# Patient Record
Sex: Female | Born: 1945 | Race: White | Hispanic: No | Marital: Single | Smoking: Former smoker
Health system: Southern US, Community
[De-identification: ages and names within clinical notes are randomized; demographics above are authoritative.]

## PROBLEM LIST (undated history)

## (undated) DIAGNOSIS — R011 Cardiac murmur, unspecified: Secondary | ICD-10-CM

## (undated) DIAGNOSIS — B019 Varicella without complication: Secondary | ICD-10-CM

## (undated) DIAGNOSIS — K219 Gastro-esophageal reflux disease without esophagitis: Secondary | ICD-10-CM

## (undated) DIAGNOSIS — I00 Rheumatic fever without heart involvement: Secondary | ICD-10-CM

## (undated) DIAGNOSIS — M1990 Unspecified osteoarthritis, unspecified site: Secondary | ICD-10-CM

## (undated) DIAGNOSIS — M359 Systemic involvement of connective tissue, unspecified: Secondary | ICD-10-CM

## (undated) DIAGNOSIS — K5792 Diverticulitis of intestine, part unspecified, without perforation or abscess without bleeding: Secondary | ICD-10-CM

## (undated) HISTORY — DX: Unspecified osteoarthritis, unspecified site: M19.90

## (undated) HISTORY — DX: Cardiac murmur, unspecified: R01.1

## (undated) HISTORY — DX: Gastro-esophageal reflux disease without esophagitis: K21.9

## (undated) HISTORY — DX: Rheumatic fever without heart involvement: I00

## (undated) HISTORY — DX: Varicella without complication: B01.9

## (undated) HISTORY — DX: Diverticulitis of intestine, part unspecified, without perforation or abscess without bleeding: K57.92

---

## 2009-01-18 ENCOUNTER — Ambulatory Visit: Payer: Self-pay | Admitting: Family Medicine

## 2009-01-27 ENCOUNTER — Ambulatory Visit: Payer: Self-pay | Admitting: Family Medicine

## 2011-01-02 ENCOUNTER — Ambulatory Visit: Payer: Self-pay | Admitting: Family Medicine

## 2011-02-01 ENCOUNTER — Inpatient Hospital Stay: Payer: Self-pay | Admitting: *Deleted

## 2011-03-27 ENCOUNTER — Emergency Department: Payer: Self-pay | Admitting: Emergency Medicine

## 2011-04-16 ENCOUNTER — Ambulatory Visit: Payer: Self-pay | Admitting: Family Medicine

## 2011-04-27 IMAGING — CR CERVICAL SPINE - 2-3 VIEW
1 series · 4 of 4 positions shown · non-contrast
Comparison: none

REASON FOR EXAM: neck pain, shoulder pain
COMMENTS:

[Series 1: view not recorded · 0.17mm/px · 4 of 4 slices shown]
[im 1/4]
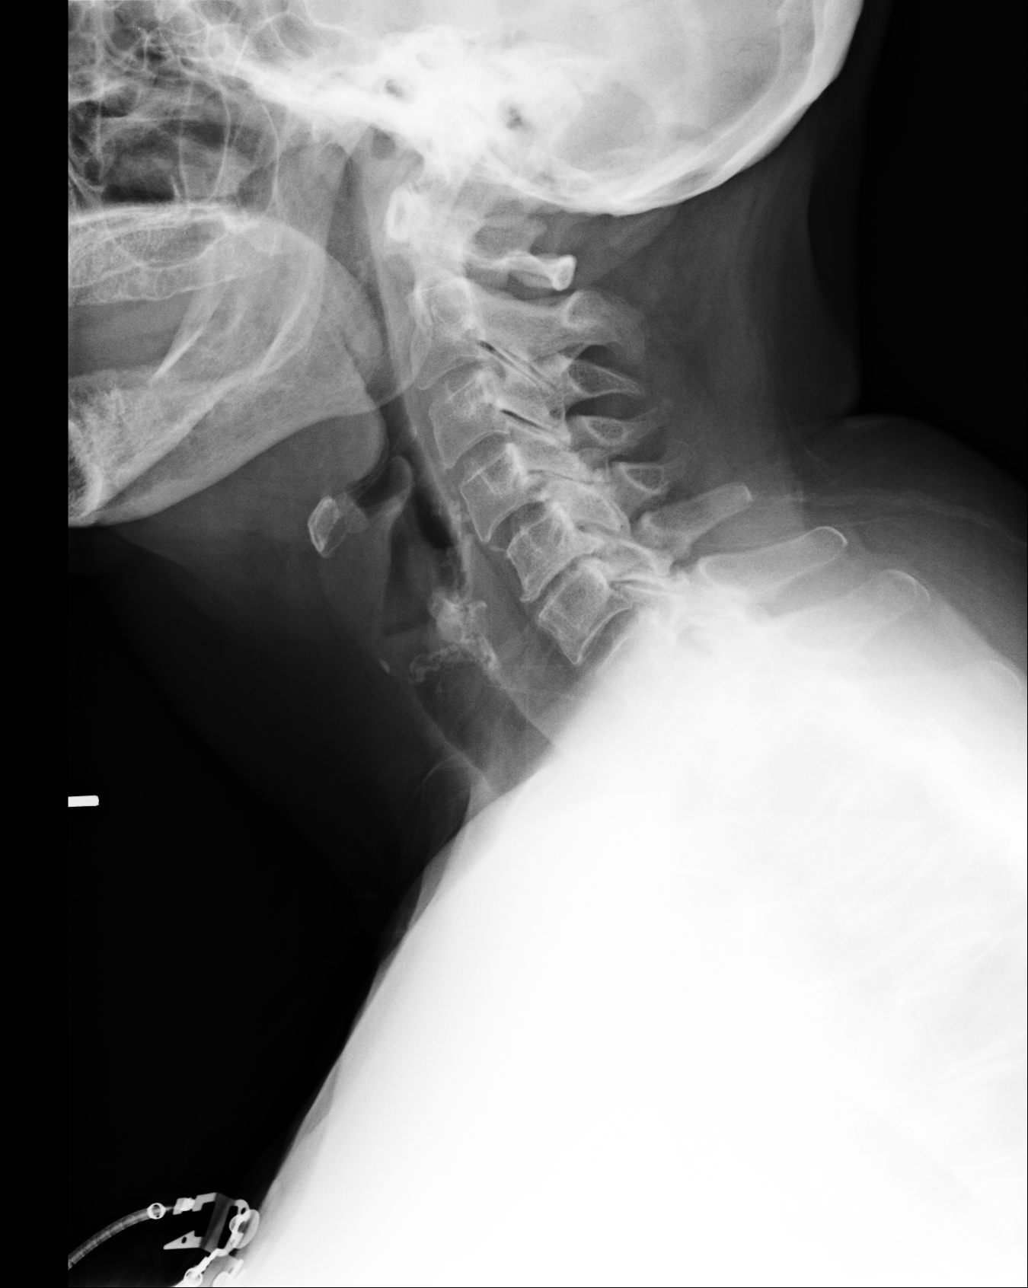
[im 2/4]
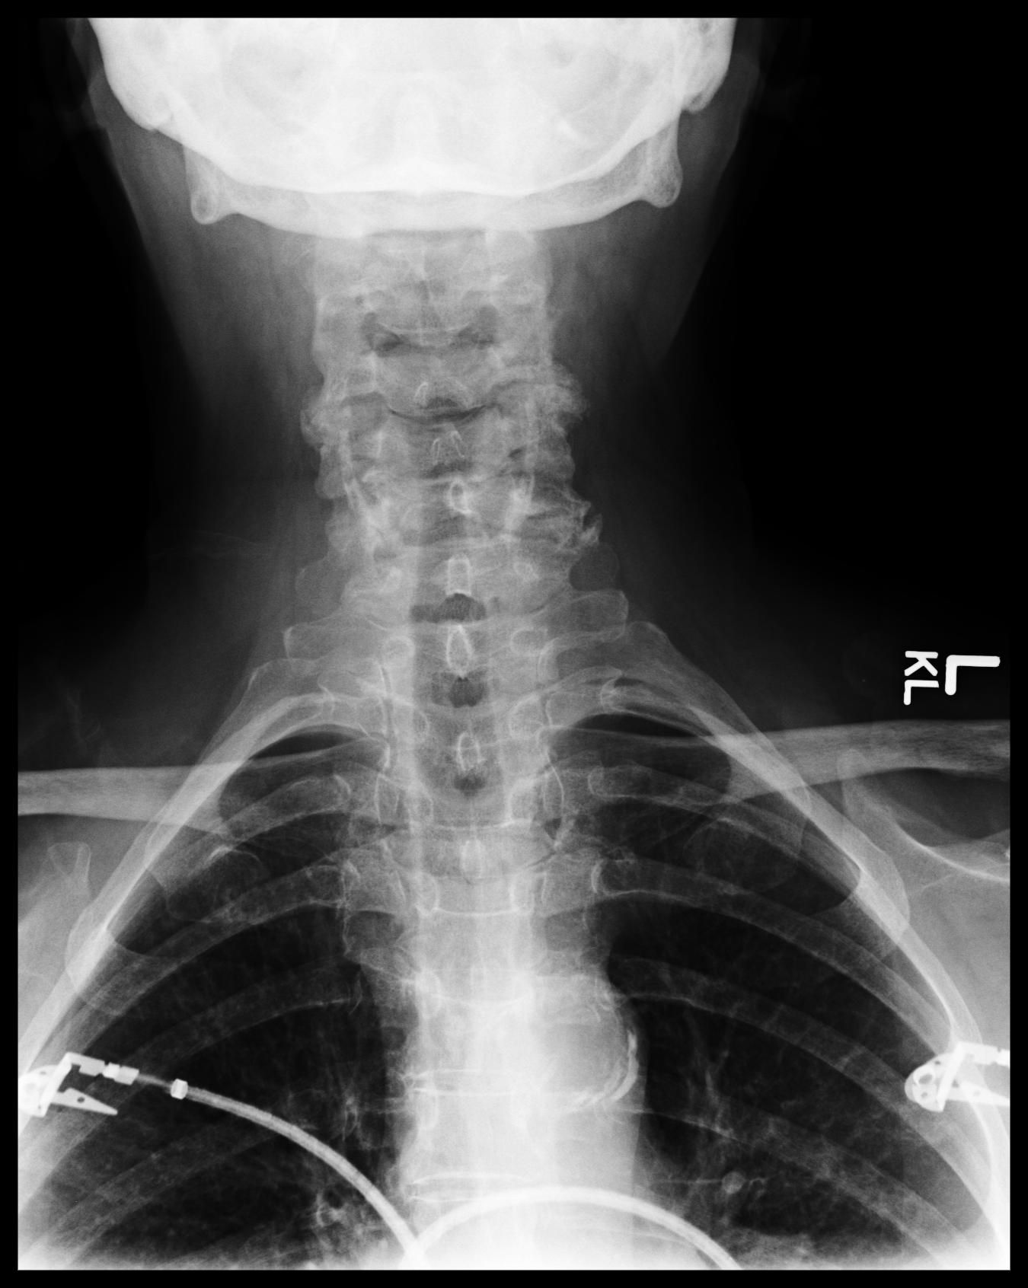
[im 3/4]
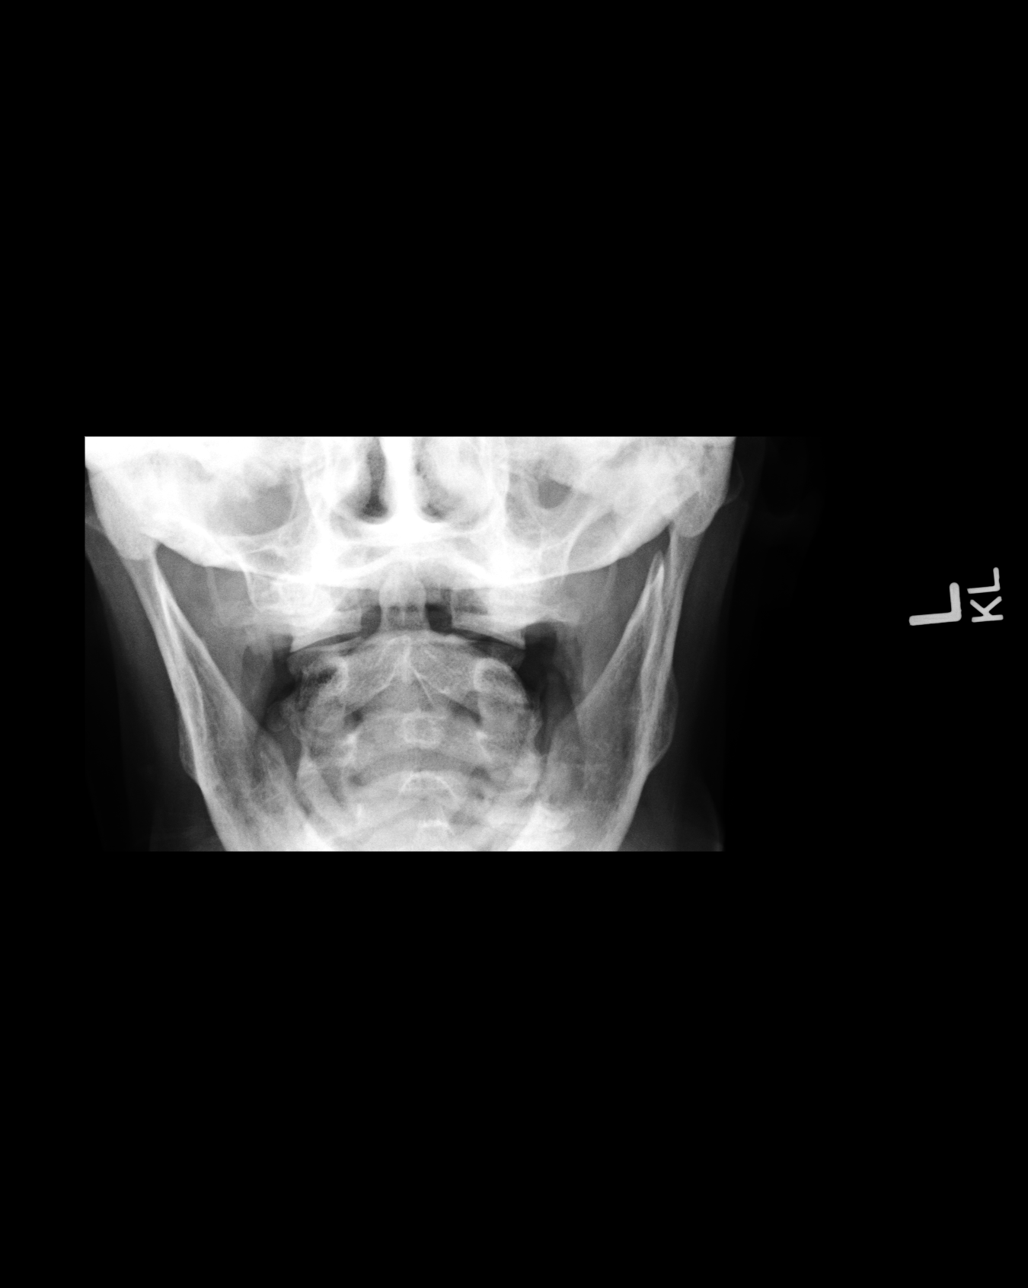
[im 4/4]
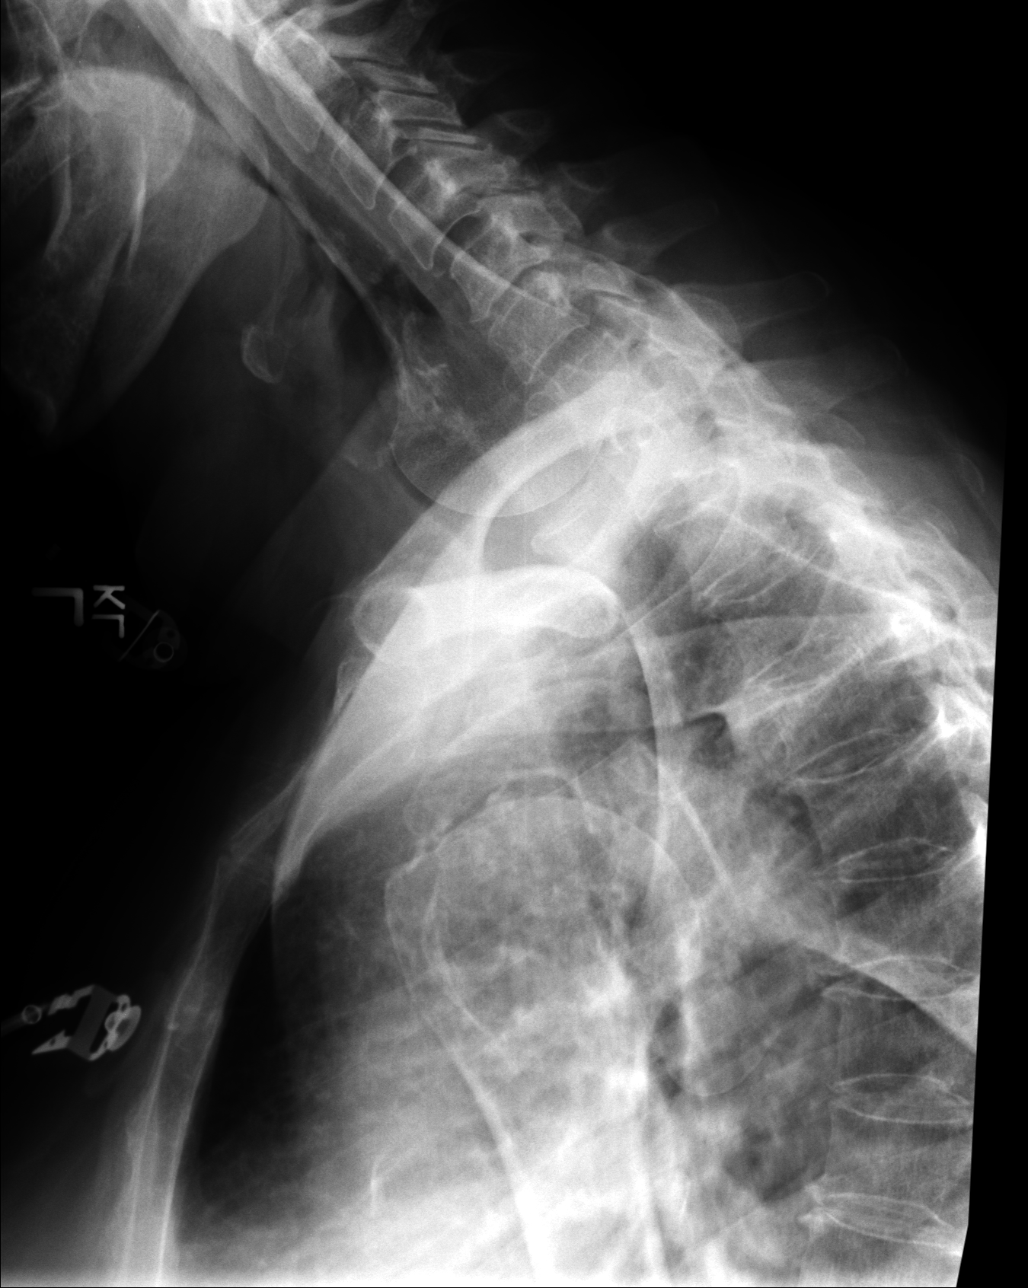

[4 of 4 positions shown; findings below may reference images not displayed]

PROCEDURE:     DXR - DXR C- SPINE AP AND LATERAL  - February 01, 2011  [DATE]

RESULT:     The vertebral body heights are well-maintained. Vertebral body
alignment is normal. No cervical fracture is identified. The odontoid
process is intact. There is narrowing of the C5-C6 cervical disc space
compatible with cervical disc disease and with there being approximately 2
mm retrolisthesis of C5 on C6 that would appear to be on a degenerative
basis. Spur formation is noted at C5-C6 anteriorly and posteriorly.
IMPRESSION: 1. No fracture or other acute change is identified.
2. There is narrowing of the C5-C6 cervical disc space compatible with
cervical disc disease and with there being associated spur formation and a
slight spondylolisthesis without spondylolysis.

## 2014-12-17 ENCOUNTER — Observation Stay: Payer: Self-pay | Admitting: Internal Medicine

## 2014-12-17 LAB — COMPREHENSIVE METABOLIC PANEL
ALBUMIN: 2.6 g/dL — AB (ref 3.4–5.0)
ALK PHOS: 86 U/L
ALT: 11 U/L — AB
Anion Gap: 8 (ref 7–16)
BUN: 7 mg/dL (ref 7–18)
Bilirubin,Total: 0.4 mg/dL (ref 0.2–1.0)
CO2: 22 mmol/L (ref 21–32)
CREATININE: 0.83 mg/dL (ref 0.60–1.30)
Calcium, Total: 8 mg/dL — ABNORMAL LOW (ref 8.5–10.1)
Chloride: 108 mmol/L — ABNORMAL HIGH (ref 98–107)
EGFR (African American): >60
EGFR (Non-African Amer.): >60
Glucose: 135 mg/dL — ABNORMAL HIGH (ref 65–99)
OSMOLALITY: 276 (ref 275–301)
Potassium: 3.3 mmol/L — ABNORMAL LOW (ref 3.5–5.1)
SGOT(AST): 17 U/L (ref 15–37)
SODIUM: 138 mmol/L (ref 136–145)
Total Protein: 7.4 g/dL (ref 6.4–8.2)

## 2014-12-17 LAB — CBC
HCT: 33.2 % — ABNORMAL LOW (ref 35.0–47.0)
HGB: 10.5 g/dL — ABNORMAL LOW (ref 12.0–16.0)
MCH: 27.3 pg (ref 26.0–34.0)
MCHC: 31.7 g/dL — ABNORMAL LOW (ref 32.0–36.0)
MCV: 86 fL (ref 80–100)
Platelet: 275 10*3/uL (ref 150–440)
RBC: 3.86 10*6/uL (ref 3.80–5.20)
RDW: 15.3 % — ABNORMAL HIGH (ref 11.5–14.5)
WBC: 10.1 10*3/uL (ref 3.6–11.0)

## 2014-12-17 LAB — CK-MB
CK-MB: 0.8 ng/mL (ref 0.5–3.6)
CK-MB: 0.7 ng/mL (ref 0.5–3.6)

## 2014-12-17 LAB — SEDIMENTATION RATE: Erythrocyte Sed Rate: 63 mm/hr — ABNORMAL HIGH (ref 0–30)

## 2014-12-17 LAB — PRO B NATRIURETIC PEPTIDE: B-TYPE NATIURETIC PEPTID: 684 pg/mL — AB (ref 0–125)

## 2014-12-17 LAB — TROPONIN I

## 2014-12-18 DIAGNOSIS — R0602 Shortness of breath: Secondary | ICD-10-CM

## 2014-12-18 DIAGNOSIS — R079 Chest pain, unspecified: Secondary | ICD-10-CM

## 2014-12-18 LAB — BASIC METABOLIC PANEL
ANION GAP: 8 (ref 7–16)
BUN: 9 mg/dL (ref 7–18)
CO2: 25 mmol/L (ref 21–32)
CREATININE: 1.04 mg/dL (ref 0.60–1.30)
Calcium, Total: 8.4 mg/dL — ABNORMAL LOW (ref 8.5–10.1)
Chloride: 106 mmol/L (ref 98–107)
GFR CALC NON AF AMER: 56 — AB
Glucose: 107 mg/dL — ABNORMAL HIGH (ref 65–99)
Osmolality: 277 (ref 275–301)
POTASSIUM: 3.4 mmol/L — AB (ref 3.5–5.1)
Sodium: 139 mmol/L (ref 136–145)

## 2014-12-18 LAB — CBC WITH DIFFERENTIAL/PLATELET
BASOS ABS: 0.1 10*3/uL (ref 0.0–0.1)
Basophil %: 1.1 %
Eosinophil #: 0.3 10*3/uL (ref 0.0–0.7)
Eosinophil %: 3.1 %
HCT: 33.5 % — ABNORMAL LOW (ref 35.0–47.0)
HGB: 10.7 g/dL — ABNORMAL LOW (ref 12.0–16.0)
LYMPHS PCT: 14.6 %
Lymphocyte #: 1.6 10*3/uL (ref 1.0–3.6)
MCH: 27.3 pg (ref 26.0–34.0)
MCHC: 31.9 g/dL — AB (ref 32.0–36.0)
MCV: 86 fL (ref 80–100)
MONO ABS: 1.4 x10 3/mm — AB (ref 0.2–0.9)
Monocyte %: 12.6 %
Neutrophil #: 7.4 10*3/uL — ABNORMAL HIGH (ref 1.4–6.5)
Neutrophil %: 68.6 %
Platelet: 306 10*3/uL (ref 150–440)
RBC: 3.91 10*6/uL (ref 3.80–5.20)
RDW: 15.4 % — AB (ref 11.5–14.5)
WBC: 10.8 10*3/uL (ref 3.6–11.0)

## 2014-12-18 LAB — LIPID PANEL
CHOLESTEROL: 172 mg/dL (ref 0–200)
HDL: 63 mg/dL — AB (ref 40–60)
LDL CHOLESTEROL, CALC: 89 mg/dL (ref 0–100)
TRIGLYCERIDES: 99 mg/dL (ref 0–200)
VLDL Cholesterol, Calc: 20 mg/dL (ref 5–40)

## 2014-12-18 LAB — TSH: Thyroid Stimulating Horm: 0.448 u[IU]/mL — ABNORMAL LOW

## 2014-12-18 LAB — CK-MB: CK-MB: 1 ng/mL (ref 0.5–3.6)

## 2014-12-18 LAB — TROPONIN I: Troponin-I: <0.02 ng/mL

## 2014-12-20 LAB — CBC WITH DIFFERENTIAL/PLATELET
BASOS ABS: 0.1 10*3/uL (ref 0.0–0.1)
Basophil %: 1.2 %
Eosinophil #: 0.5 10*3/uL (ref 0.0–0.7)
Eosinophil %: 5.9 %
HCT: 38.5 % (ref 35.0–47.0)
HGB: 12.1 g/dL (ref 12.0–16.0)
LYMPHS PCT: 23.3 %
Lymphocyte #: 2 10*3/uL (ref 1.0–3.6)
MCH: 26.8 pg (ref 26.0–34.0)
MCHC: 31.4 g/dL — AB (ref 32.0–36.0)
MCV: 85 fL (ref 80–100)
Monocyte #: 1.1 x10 3/mm — ABNORMAL HIGH (ref 0.2–0.9)
Monocyte %: 13.3 %
Neutrophil #: 4.7 10*3/uL (ref 1.4–6.5)
Neutrophil %: 56.3 %
Platelet: 341 10*3/uL (ref 150–440)
RBC: 4.5 10*6/uL (ref 3.80–5.20)
RDW: 15.3 % — ABNORMAL HIGH (ref 11.5–14.5)
WBC: 8.4 10*3/uL (ref 3.6–11.0)

## 2014-12-20 LAB — BASIC METABOLIC PANEL
Anion Gap: 8 (ref 7–16)
BUN: 20 mg/dL — ABNORMAL HIGH (ref 7–18)
CHLORIDE: 99 mmol/L (ref 98–107)
Calcium, Total: 8.4 mg/dL — ABNORMAL LOW (ref 8.5–10.1)
Co2: 27 mmol/L (ref 21–32)
Creatinine: 1.05 mg/dL (ref 0.60–1.30)
EGFR (African American): >60
EGFR (Non-African Amer.): 55 — ABNORMAL LOW
GLUCOSE: 114 mg/dL — AB (ref 65–99)
OSMOLALITY: 272 (ref 275–301)
Potassium: 3.5 mmol/L (ref 3.5–5.1)
Sodium: 134 mmol/L — ABNORMAL LOW (ref 136–145)

## 2015-04-22 NOTE — Consult Note (Signed)
General Aspect 69 year old white female with a history of COPD, hypertension, chronic bronchitis, GERD, rheumatoid arthritis, fibromyalgia, very deconditioned, who is currently visiting her daughter from [place], presenting with swelling in her ankles, left side chest pain, SOB. Cardiology was consulted for above sx.  Since flying down from [place], she has had swelling of her ankles, getting worse in the past 10 days, also chest pain the past few days coming and going at rest. SOB with any exertion.  She does have some pain in both of the ankles. She also started having palpitations   She reports the chest pressure to be unrelated to any activity, and it is not worse with exertion. No nausea, no vomiting. The patient has limited ambulation due to her arthritis and fibromyalgia.  She came to [place] to see whether she should go to a nursing home. Daughter also has health issues. Patient is having trouble "washing herself" at home.  review of records so far shows normal echo, negative cardiac enz, normal EKG Some mild left side chest pain, arm pain today  ("maybe it is my heart") LE doppler, no DVT   PAST MEDICAL HISTORY:  1.  COPD.  2.  Hypertension.  3.  Chronic bronchitis.  4.  Gastroesophageal reflux disease.  5.  Rheumatoid arthritis.  6.  Fibromyalgia.   ALLERGIES:  QUESTIONABLE ALLERGY TO PENICILLIN.   MEDICATIONS AT HOME: She is on Stiolto Respimat 2 puffs once a day; Protonix 40 q. daily; methotrexate 2.5, 8 tablets once a week on Monday; lisinopril 20 q. daily; folic acid 1 mg q. daily; aspirin 81, 1 tab p.o. q. daily; amitriptyline 25 mg 2 tablets at bedtime as needed; alprazolam 0.25 q. daily as needed; acetaminophen hydrocodone 325/10, 1 tablet 4 times a day as needed.   SOCIAL HISTORY:  History of smoking, quit 3 years ago, 50 pack-year history. No alcohol or drug use.   FAMILY HISTORY:  Mother with heart disease.   Physical Exam:  GEN well developed, well  nourished, no acute distress   HEENT hearing intact to voice, moist oral mucosa   NECK supple   RESP normal resp effort  clear BS   CARD Regular rate and rhythm  No murmur   ABD denies tenderness   LYMPH negative neck   EXTR negative edema   SKIN normal to palpation   NEURO motor/sensory function intact   PSYCH alert, A+O to time, place, person, good insight   Review of Systems:  Subjective/Chief Complaint chest pain, SOB, leg pain and swelling   General: Fatigue  Weakness  legs weak, chronic issue   Skin: No Complaints   ENT: No Complaints   Eyes: No Complaints   Neck: No Complaints   Respiratory: Short of breath   Cardiovascular: Chest pain or discomfort  Dyspnea   Gastrointestinal: No Complaints   Genitourinary: No Complaints   Vascular: No Complaints   Musculoskeletal: weakness   Neurologic: No Complaints   Hematologic: No Complaints   Endocrine: No Complaints   Psychiatric: No Complaints   Review of Systems: All other systems were reviewed and found to be negative   Medications/Allergies Reviewed Medications/Allergies reviewed     Rheumatoid Arthritis:    Fibromyalgia:    bronchitis:    copd:    htn:   Home Medications: Medication Instructions Status  lisinopril 20 mg tablet 1 tab(s) orally once a day  Active  Stiolto Respimat 2.5 mcg-2.5 mcg inhalation aerosol 2 puff(s) inhaled once a day, As Needed Active  ALPRAZolam 0.25 mg oral tablet 1 tab(s) orally once a day as needed for sleep. Active  amitriptyline 25 mg oral tablet 2 tabs (42m) orally once a day (at bedtime) as needed Active  Aspirin Enteric Coated 81 mg oral delayed release tablet 1 tab(s) orally once a day Active  folic acid 1 mg oral tablet 1 tab(s) orally once a day as needed Active  methotrexate 2.5 mg oral tablet 8 tabs (246m orally once a week on Monday. Active  pantoprazole 40 mg oral delayed release tablet 1 tab(s) orally once a day, As Needed - for Indigestion,  Heartburn Active  acetaminophen-HYDROcodone 325 mg-10 mg oral tablet 1 tab(s) orally 4 times a day, As Needed - for Pain Active   Lab Results:  Thyroid:  20-Dec-15 00:55   Thyroid Stimulating Hormone  0.448 (0.45-4.50 (IU = International Unit)  ----------------------- Pregnant patients have  different reference  ranges for TSH:  - - - - - - - - - -  Pregnant, first trimetser:  0.36 - 2.50 uIU/mL)  Routine Chem:  20-Dec-15 00:55   Glucose, Serum  107  BUN 9  Creatinine (comp) 1.04  Sodium, Serum 139  Potassium, Serum  3.4  Chloride, Serum 106  CO2, Serum 25  Calcium (Total), Serum  8.4  Anion Gap 8  Osmolality (calc) 277  eGFR (African American) >60  eGFR (Non-African American)  56 (eGFR values <6079min/1.73 m2 may be an indication of chronic kidney disease (CKD). Calculated eGFR, using the MRDR Study equation, is useful in  patients with stable renal function. The eGFR calculation will not be reliable in acutely ill patients when serum creatinine is changing rapidly. It is not useful in patients on dialysis. The eGFR calculation may not be applicable to patients at the low and high extremes of body sizes, pregnant women, and vegetarians.)  Cholesterol, Serum 172  Triglycerides, Serum 99  HDL (INHOUSE)  63  VLDL Cholesterol Calculated 20  LDL Cholesterol Calculated 89 (Result(s) reported on 18 Dec 2014 at 01:17AM.)  Cardiac:  19-Dec-15 15:39   Troponin I < 0.02 (0.00-0.05 0.05 ng/mL or less: NEGATIVE  Repeat testing in 3-6 hrs  if clinically indicated. >0.05 ng/mL: POTENTIAL  MYOCARDIAL INJURY. Repeat  testing in 3-6 hrs if  clinically indicated. NOTE: An increase or decrease  of 30% or more on serial  testing suggests a  clinically important change)    20:52   Troponin I < 0.02 (0.00-0.05 0.05 ng/mL or less: NEGATIVE  Repeat testing in 3-6 hrs  if clinically indicated. >0.05 ng/mL: POTENTIAL  MYOCARDIAL INJURY. Repeat  testing in 3-6 hrs if  clinically  indicated. NOTE: An increase or decrease  of 30% or more on serial  testing suggests a  clinically important change)  20-Dec-15 00:55   CPK-MB, Serum 1.0 (Result(s) reported on 18 Dec 2014 at 01:27AM.)  Troponin I < 0.02 (0.00-0.05 0.05 ng/mL or less: NEGATIVE  Repeat testing in 3-6 hrs  if clinically indicated. >0.05 ng/mL: POTENTIAL  MYOCARDIAL INJURY. Repeat  testing in 3-6 hrs if  clinically indicated. NOTE: An increase or decrease  of 30% or more on serial  testing suggests a  clinically important change)  Routine Hem:  20-Dec-15 00:55   WBC (CBC) 10.8  RBC (CBC) 3.91  Hemoglobin (CBC)  10.7  Hematocrit (CBC)  33.5  Platelet Count (CBC) 306  MCV 86  MCH 27.3  MCHC  31.9  RDW  15.4  Neutrophil % 68.6  Lymphocyte % 14.6  Monocyte %  12.6  Eosinophil % 3.1  Basophil % 1.1  Neutrophil #  7.4  Lymphocyte # 1.6  Monocyte #  1.4  Eosinophil # 0.3  Basophil # 0.1 (Result(s) reported on 18 Dec 2014 at 01:10AM.)   EKG:  Interpretation EKG shows normal sinus rhythm, no significant ST ro T wave changes   Radiology Results: XRay:    19-Dec-15 15:22, Chest Portable Single View  Chest Portable Single View   REASON FOR EXAM:    Hypertension  COMMENTS:       PROCEDURE: DXR - DXR PORTABLE CHEST SINGLE VIEW  - Dec 17 2014  3:22PM     CLINICAL DATA:  Bilateral ankle swelling    EXAM:  PORTABLE CHEST - 1 VIEW    COMPARISON:  Radiograph 02/21/2011    FINDINGS:  Normal cardiac silhouette. No effusion, infiltrate, pneumothorax.  Mild bronchitic change centrally.   IMPRESSION:  No acute cardiopulmonary process.      Electronically Signed    By: Suzy Bouchard M.D.    On: 12/17/2014 16:20         Verified By: Rennis Golden, M.D.,  Cardiology:    20-Dec-15 09:29, Echo Doppler  Echo Doppler   REASON FOR EXAM:      COMMENTS:       PROCEDURE: [facility] - ECHO DOPPLER COMPLETE(TRANSTHOR)  - Dec 18 2014  9:29AM     RESULT: Echocardiogram Report    Patient Name:    Linda Mcdaniel Date of Exam: 12/18/2014  Medical Rec #:  [id number]          Custom1:  Date of Birth:  25-Mar-1946       Height:       60.0 in  Patient Age:    53 years        Weight:       162.0 lb  Patient Gender: F               BSA:          1.71 m??    Indications: SOB  Sonographer:    Arville Go RDCS  Referring Phys: Dustin Flock, H    Summary:   1. Essentially a normal study.   2. Left ventricular ejection fraction, by visual estimation, is 60 to   65%.   3. Normal global left ventricular systolic function.   4. Normal right ventricular size and systolic function.   5. Normal RVSP  2D AND M-MODE MEASUREMENTS (normal ranges within parentheses):  Left Ventricle:          Normal  IVSd (2D):      0.96 cm (0.7-1.1)  LVPWd (2D):     1.08 cm (0.7-1.1) Aorta/LA:                  Normal  LVIDd (2D):     4.56 cm (3.4-5.7) AorticRoot (2D): 2.50 cm (2.4-3.7)  LVIDs (2D):     3.14 cm           Left Atrium (2D): 3.50 cm (1.9-4.0)  LV FS (2D):     31.1 %   (>25%)  LV EF (2D):     59.0 %   (>50%)                                    Right Ventricle:    RVd (2D):  LV DIASTOLIC FUNCTION:  MV Peak E: 1.03 m/s E/e' Ratio:  19.90  MV Peak A: 1.20 m/s Decel Time: 248 msec  E/A Ratio: 0.86  SPECTRAL DOPPLER ANALYSIS (where applicable):  Mitral Valve:  MV P1/2 Time: 71.92 msec  MV Area, PHT: 3.06 cm??  Aortic Valve: AoV Max Vel: 1.87 m/s AoV Peak PG: 14.0 mmHg AoV Mean PG:   7.0 mmHg  LVOT Vmax: 1.37 m/s LVOT VTI:  LVOT Diameter: 1.90 cm  AoV Area, Vmax: 2.08 cm?? AoV Area, VTI:  AoV Area, Vmn:  Pulmonic Valve:  PV Max Velocity: 1.40 m/s PV Max PG: 7.8 mmHg PV Mean PG:    PHYSICIAN INTERPRETATION:  Left Ventricle: The left ventricular internal cavity size was normal. LV   posterior wall thickness was normal. No left ventricular hypertrophy.   Global LV systolic function was normal. Left ventricular ejection   fraction, by visual estimation, is 60 to 65%.  Right Ventricle: Normal right  ventricular size, wall thickness, and   systolic function. The right ventricular size is normal. Global RV   systolic function is normal.  Left Atrium: The left atrium is normal in size.  Right Atrium: The right atrium is normal in size.  Pericardium: There is no evidence of pericardial effusion.  Mitral Valve: The mitral valve is normal in structure. Trace mitral valve   regurgitation is seen.  Tricuspid Valve: The tricuspid valve is normal. Trivial tricuspid   regurgitation is visualized.  Aortic Valve: The aortic valve is normal. The aortic valve is   structurally normal, with no evidence of sclerosis or stenosis. No   evidence of aortic valve regurgitation is seen.  Pulmonic Valve: The pulmonic valve is normal. Trace pulmonic valve   regurgitation.  Aorta: The aortic root and ascending aorta are structurally normal, with   no evidence of dilitation.    [id number] Ida Rogue MD  Electronically signed by [id number] Ida Rogue MD  Signature Date/Time: 12/18/2014/12:44:27 PM  *** Final ***    IMPRESSION: .        Verified By: Minna Merritts, M.D., MD    Penicillin: Rash  Vital Signs/Nurse's Notes: **Vital Signs.:   20-Dec-15 11:37  Vital Signs Type Routine  Temperature Temperature (F) 98.2  Celsius 36.7  Temperature Source oral  Pulse Pulse 73  Respirations Respirations 18  Systolic BP Systolic BP 606  Diastolic BP (mmHg) Diastolic BP (mmHg) 65  Mean BP 81  Pulse Ox % Pulse Ox % 96  Pulse Ox Activity Level  At rest  Oxygen Delivery Room Air/ 21 %    Impression 69 year old white female with a history of COPD, hypertension, chronic bronchitis, GERD, rheumatoid arthritis, fibromyalgia, very deconditioned, who is currently visiting her daughter from [place], presenting with swelling in her ankles, left side chest pain, SOB. Cardiology was consulted for above sx.  1) chest pain/angina: 50 yr smoking hx, obese. stuttering chest pain.  Moderate risk of CAD given  hx, some atypical features (at rest) She is concerned about sx, some family hx --Will schedule a lexiscan myoview for tomorrow to rule out ischemia If stress is negative, possibly from fibromyalgia/arthritis  2) Leg swelling normal echo, unable to exclude mild diastolic CHF, there were normal pressures on echo (RVSP <30) but after diuresis --possible exacerbation from venous insuff. --Recommended lasix PRN for leg swelling and TED hose. Order placed for TED hose.  3) SOB: again possibly from mild diastolic CHF (normal RVSP on echo after diuresis though). She is very deconditioned. Needs outpt regular exercise program. This was discussed with her.  Stress test to  rule out ischemia as a cause of her SOB tomorrow  4) Obesity/deconditioning Needs dietary and lifestyle changes Discussed with her  5)Fibromyalgia: she is try to "avoid" pain pills. Currently have left arm pain  6)Arthitis: limits her exercise, also not motivated  7) HTN: would continue lisinopril BP better this AM   Electronic Signatures: Ida Rogue (MD)  (Signed 20-Dec-15 14:51)  Authored: General Aspect/Present Illness, History and Physical Exam, Review of System, Past Medical History, Home Medications, Labs, EKG , Radiology, Allergies, Vital Signs/Nurse's Notes, Impression/Plan   Last Updated: 20-Dec-15 14:51 by Ida Rogue (MD)

## 2015-04-26 NOTE — H&P (Signed)
PATIENT NAME:  Linda Mcdaniel, Linda Mcdaniel MR#:  [id number] DATE OF BIRTH:  March 04, 1946  DATE OF ADMISSION:  12/17/2014  PRIMARY CARE PROVIDER: In [place].   EMERGENCY DEPARTMENT REFERRING DOCTOR: Gladstone Pih, MD    CHIEF COMPLAINT: Chest pressure, ankle swelling.   HISTORY OF PRESENT ILLNESS: The patient is a 69 year old white female with a history of COPD, hypertension, chronic bronchitis, GERD, rheumatoid arthritis, fibromyalgia who is currently visiting her daughter from [place], who noticed that she started swelling in her ankles, both ankles, for the past 10 days; progressively worse. The patient does not have any calf pain. She does not have any significant erythema in the lower extremities. She does have some pain in both of the ankles. She also started having palpitations on and off ongoing since for the past few days. She also complains of left-sided chest pressure ongoing for the past few days. She reports the chest pressure to be unrelated to any activity, and it is not worse with exertion. No nausea, no vomiting. The patient has limited ambulation due to her arthritis and fibromyalgia.   PAST MEDICAL HISTORY:  1.  COPD.  2.  Hypertension.  3.  Chronic bronchitis.  4.  Gastroesophageal reflux disease.  5.  Rheumatoid arthritis.  6.  Fibromyalgia.   ALLERGIES: QUESTIONABLE ALLERGY TO PENICILLIN.   MEDICATIONS AT HOME: She is on Stiolto Respimat 2 puffs once a day; Protonix 40 q. daily; methotrexate 2.5, 8 tablets once a week on Monday; lisinopril 20 q. daily; folic acid 1 mg q. daily; aspirin 81, 1 tab p.o. q. daily; amitriptyline 25 mg 2 tablets at bedtime as needed; alprazolam 0.25 q. daily as needed; acetaminophen hydrocodone 325/10, 1 tablet 4 times a day as needed.   SOCIAL HISTORY: History of smoking, quit 3 years ago, 50 pack-year history. No alcohol or drug use.   FAMILY HISTORY: Mother with heart disease.  REVIEW OF SYSTEMS:  CONSTITUTIONAL: Denies any fevers. Complains of  fatigue, weakness. No weight loss. No weight gain.  EYES: No blurred or double vision. No redness. No inflammation.  ENT: No tinnitus. No ear pain. No hearing loss. No seasonal or year-round allergies.  RESPIRATORY: Has chronic cough. No wheezing. Has a history of COPD.  CARDIOVASCULAR: Complains of chest pressure, edema.  GASTROINTESTINAL: No nausea, vomiting, diarrhea. No abdominal pain. No hematemesis. No melena.  GENITOURINARY: Denies any dysuria, hematuria, renal colic, or frequency.  ENDOCRINE: Denies any polyuria, nocturia. HEMATOLOGIC AND LYMPHATIC: Denies anemia, easy bruisability or bleeding.  SKIN: No acne. No rash.  MUSCULOSKELETAL: Has rheumatoid arthritis, pain related to fibromyalgia.  NEUROLOGIC: No numbness, CVA, TIA, or seizures.  PSYCHIATRIC: Denies any anxiety, insomnia.   PHYSICAL EXAMINATION:  VITAL SIGNS: Temperature 98.3, pulse 92, respirations 19, blood pressure 183/92, O2 of 97%.  GENERAL: The patient is an obese female in no acute distress.  HEENT: Head atraumatic, normocephalic. Pupils equally round, reactive to light and accommodation. There is no conjunctival pallor. No scleral icterus. Nasal exam shows no drainage or ulceration. Oropharynx is clear without any exudate.  NECK: Supple without any thyromegaly.  CARDIOVASCULAR: Regular rate and rhythm. No murmurs, rubs, clicks, or gallops.  LUNGS: Clear to auscultation bilaterally without any rales, rhonchi, wheezing.  ABDOMEN: Soft, nontender, nondistended. Positive bowel sounds x 4.  EXTREMITIES: She has got 1+ edema in her ankles. There is no warmth or erythema.  LYMPH NODES: Nonpalpable.  MUSCULOSKELETAL: There is no erythema or swelling.  NEUROLOGICAL: Awake, alert, oriented x 3. No focal deficits.   EVALUATION: EKG:  Normal sinus rhythm without any ST-T wave changes. Ultrasound Doppler of the lower extremities is negative for DVT. Glucose 135, BUN 7, creatinine 0.83, sodium 138, potassium 3.3, chloride 108,  calcium 8.0. LFTs:  Total protein 7.4, albumin 2.6, bilirubin total 0.4, AST is 11. Troponin less than 0.02. WBC 10.1, hemoglobin 10.5, platelet count 275,000.   ASSESSMENT AND PLAN: The patient is a 69 year old who presents to the Emergency Department  with chest pressure and bilateral lower extremity ankle swelling.  1.  Chest pressure. Multiple risk factors for coronary artery disease. We will cycle cardiac enzymes, place her on aspirin. Cardiology evaluation for further recommendations. 2.  Ankle swelling. We will do compression stockings of the lower extremities, give her some intravenous Lasix. Check a sedimentation rate.  3.  Hypertension. Continue lisinopril; p.r.n. hydralazine. 4.  Rheumatoid arthritis, fibromyalgia. Continue amitriptyline and pain medication as taking at home.  5.  Hypokalemia. I will give her a dose of potassium.  6.  Chronic obstructive pulmonary disease. Continue inhalers as taking at home.  7.  Miscellaneous. The patient will be on Lovenox for deep vein thrombosis prophylaxis.  TIME SPENT: On this patient: 50 minutes.   ____________________________ Lacie Scotts Allena Katz, MD shp:ST D: 12/17/2014 21:20:19 ET T: 12/17/2014 22:12:11 ET JOB#: [id number]  cc: Jazzlyn Huizenga H. Allena Katz, MD, <Dictator> Charise Carwin MD ELECTRONICALLY SIGNED 01/01/2015 12:13

## 2015-04-26 NOTE — Discharge Summary (Signed)
PATIENT NAME:  Linda Mcdaniel, Linda Mcdaniel MR#:  [id number] DATE OF BIRTH:  19-May-1946  DATE OF ADMISSION:  12/17/2014 DATE OF DISCHARGE:  12/20/2014  ADMITTING DIAGNOSIS: Chest pressure and ankle swelling.   DISCHARGE DIAGNOSES: 1. Progressive chest pressure due to non-cardiac cause. 2. Dependent edema.  3. Hypertension.  4. Rheumatoid arthritis.  5. Fibromyalgia.  6. Chronic obstructive pulmonary disease with chronic bronchitis.  7. Gastroesophageal reflux disease.   CONSULTATIONS: Dr. Julien Nordmann, cardiology.   PROCEDURES: 1.  Cardiac stress test, 12/19/2014, shows no ischemia. Reviewed by Dr. Mariah Milling.  2.  Chest x-ray, December 19th, shows no acute cardiopulmonary process. A. 3.  A 2-D echocardiogram performed 12/18/2014, shows left ventricular ejection fraction 60% to 65%. Normal global left ventricular systolic function. Normal right ventricular size and systolic function.  4.  Bilateral lower extremity Dopplers, December 19th, showed no deep venous thrombosis.   HISTORY OF PRESENT ILLNESS: This 69 year old female with past medical history of COPD, hypertension, chronic bronchitis, gastroesophageal reflux disease, rheumatoid arthritis, and fibromyalgia, who is visiting her daughter from [place], presents with complaint of swelling in both ankles for the past several days, which has been getting progressively worse. She also has started having some palpitations and chest pressure.   HOSPITAL COURSE BY PROBLEM:  1.  Chest pressure: The patient ruled out for acute coronary syndrome with negative cardiac enzymes x 3 and no evidence of arrhythmia on telemetry. EKG showed no signs of ischemic change. A 2-D echocardiogram is normal with ejection fraction 60% to 65%. Chest x-ray was negative for pulmonary edema or pneumonia. Lower extremity Dopplers negative for DVT. She did have a Lexiscan, which was negative for any signs of ischemia. Her progressive chest pressure is not likely cardiac in nature.   2.  Bilateral ankle swelling: Bilateral lower extremity Doppler is negative for DVT. She responded very well to Lasix diuresis. Lower extremity swelling has decreased by the time of discharge. She did just fly here from [place]. She endorses a very sedentary lifestyle and is obese. This is most likely chronic venous stasis.  3.  Hypertension: Blood pressure was well controlled on lisinopril and also responded well to Lasix given while in hospital.  4.  Deconditioning:  Physical therapy has evaluated this patient and recommended SNF or skilled nursing facility, but the patient has declined skilled nursing placement, as she is here from [place] and is visiting her daughter. She is being discharged with home health physical therapy.   PERTINENT LABORATORY DATA: Sodium 134, potassium 3.5, chloride 99, bicarbonate 27, BUN 20, creatinine 1.05, glucose 114, total cholesterol 172, LDL is 89, HDL is 63. LFTs are normal with the exception of a decreased albumin at 2.6. Cardiac enzymes negative x 3. TSH slightly low at 0.448. White blood cells 8.4, hemoglobin 12.1, platelets 341, MCV 85.   DISCHARGE PHYSICAL EXAMINATION: VITAL SIGNS: Temperature 97.5, pulse 74, respirations 18, blood pressure 104/58, oxygenation 95% on room air.  GENERAL: No acute distress.  RESPIRATORY: Lungs clear to auscultation bilaterally with good air movement.  CARDIOVASCULAR: Regular rate and rhythm. No murmurs, rubs, or gallops. Peripheral pulses are 2+., and there is trace edema bilaterally, which is equal.  PSYCHIATRIC: The patient seems slightly depressed. She is alert and oriented x4 with good insight into her clinical condition.   CONDITION ON DISCHARGE:  Stable.   DISPOSITION: The patient is being discharged home with home health physical therapy.   DISCHARGE MEDICATIONS: 1. Lisinopril 20 mg 1 tablet daily.  2. Stiolto Respimat 2.5 mcg/2.5 mcg  2 puffs inhaled once a day.  3. Alprazolam 0.25 mg 1 tablet once a day as  needed for sleep.  4. Amitriptyline 25 mg 2 tablets orally once a day at bedtime as needed for sleep.  5. Aspirin 81 mg 1 tablet daily.  6. Folic acid 1 mg 1 tablet daily.  7. Methotrexate 2.5 mg 8 tablets once a week on Mondays.  8. Pantoprazole 40 mg 1 tablet once a day as needed for heartburn.  9. Acetaminophen/hydrocodone 325/10 mg 1 tablet 4 times a day as needed for pain.   DISCHARGE INSTRUCTIONS: DIET: Low-sodium.   ACTIVITY: As tolerated, no restrictions.   TIMEFRAME FOR FOLLOWUP: Follow up within 1-2 weeks with your primary care physician or as soon as you return to [place]. The patient has been instructed to have her primary care physician call our medical records department to have her medical records from this hospitalization faxed, as she did not have their phone number at the time of her admission.   TIME SPENT ON DISCHARGE: 35 minutes.    ____________________________ Ena Dawley. Clent Ridges, MD cpw:mw D: 12/30/2014 19:01:57 ET T: 12/30/2014 19:22:33 ET JOB#: [id number]  cc: Santina Evans P. Clent Ridges, MD, <Dictator> Gale Journey MD ELECTRONICALLY SIGNED 01/01/2015 20:10

## 2016-04-01 ENCOUNTER — Encounter: Payer: Self-pay | Admitting: Nurse Practitioner

## 2016-04-01 ENCOUNTER — Ambulatory Visit (INDEPENDENT_AMBULATORY_CARE_PROVIDER_SITE_OTHER): Payer: Medicare Other | Admitting: Nurse Practitioner

## 2016-04-01 VITALS — BP 130/80 | HR 76 | Temp 98.4°F | Ht <= 58 in | Wt 142.8 lb

## 2016-04-01 DIAGNOSIS — R6889 Other general symptoms and signs: Secondary | ICD-10-CM

## 2016-04-01 DIAGNOSIS — K219 Gastro-esophageal reflux disease without esophagitis: Secondary | ICD-10-CM

## 2016-04-01 DIAGNOSIS — M069 Rheumatoid arthritis, unspecified: Secondary | ICD-10-CM | POA: Diagnosis not present

## 2016-04-01 DIAGNOSIS — Z7689 Persons encountering health services in other specified circumstances: Secondary | ICD-10-CM

## 2016-04-01 DIAGNOSIS — J449 Chronic obstructive pulmonary disease, unspecified: Secondary | ICD-10-CM

## 2016-04-01 DIAGNOSIS — Z7189 Other specified counseling: Secondary | ICD-10-CM | POA: Diagnosis not present

## 2016-04-01 DIAGNOSIS — I00 Rheumatic fever without heart involvement: Secondary | ICD-10-CM

## 2016-04-01 DIAGNOSIS — R011 Cardiac murmur, unspecified: Secondary | ICD-10-CM

## 2016-04-01 DIAGNOSIS — Z791 Long term (current) use of non-steroidal anti-inflammatories (NSAID): Secondary | ICD-10-CM

## 2016-04-01 DIAGNOSIS — K5730 Diverticulosis of large intestine without perforation or abscess without bleeding: Secondary | ICD-10-CM

## 2016-04-01 DIAGNOSIS — Z86718 Personal history of other venous thrombosis and embolism: Secondary | ICD-10-CM

## 2016-04-01 DIAGNOSIS — I10 Essential (primary) hypertension: Secondary | ICD-10-CM

## 2016-04-01 DIAGNOSIS — IMO0001 Reserved for inherently not codable concepts without codable children: Secondary | ICD-10-CM

## 2016-04-01 DIAGNOSIS — M797 Fibromyalgia: Secondary | ICD-10-CM

## 2016-04-01 NOTE — Progress Notes (Signed)
Patient ID: Linda Mcdaniel, female    DOB: [birth year]  Age: 70 y.o. MRN: [id number]  CC: Establish Care   HPI Linda Mcdaniel presents for establishing care and CC of overall arthralgias. She is accompanied by her daughter today.   1) In pain- all over she reports  Has RA and was on methotrexate for more than 5 yrs, but less than 10 yrs. Had a rheumatologist in [place]. Chooses not to pursue a referral at this time. She reports she would like to permanently refuse mammograms and colonoscopies.   Pt also reports a h/o fibromyalgia  Ibuprofen and Naproxen- every once an awhile for when it gets bad. She is only taking a multivitamin daily currently. She does not like to take medications. She is currently "dealing" with the pain and is afraid of pain medications. She would like to discuss home PT as a possibility.    History Linda Mcdaniel has a past medical history of Arthritis; Chicken pox; GERD (gastroesophageal reflux disease); Diverticulitis; Heart murmur; and Rheumatic fever.   She has past surgical history that includes Cesarean section (1965) and Cesarean section (1978).   Her family history includes Alcohol abuse in her brother, father, and mother; Arthritis in her maternal grandmother; Diabetes in her sister; Heart disease in her mother; Hypertension in her mother; Stroke in her maternal grandmother and mother.She reports that she has quit smoking. She has never used smokeless tobacco. She reports that she does not drink alcohol or use illicit drugs.  No outpatient prescriptions prior to visit.   No facility-administered medications prior to visit.    ROS Review of Systems  Constitutional: Negative for fever, chills, diaphoresis and fatigue.  Respiratory: Negative for chest tightness, shortness of breath and wheezing.   Cardiovascular: Negative for chest pain, palpitations and leg swelling.  Gastrointestinal: Negative for nausea, vomiting and diarrhea.  Musculoskeletal: Positive for  myalgias, joint swelling, arthralgias and gait problem. Negative for back pain, neck pain and neck stiffness.  Skin: Negative for rash.  Neurological: Negative for dizziness, weakness, numbness and headaches.  Psychiatric/Behavioral: The patient is not nervous/anxious.     Objective:  BP 130/80 mmHg  Pulse 76  Temp(Src) 98.4 F (36.9 C) (Oral)  Ht 4' 9.8" (1.468 m)  Wt 142 lb 12 oz (64.751 kg)  BMI 30.05 kg/m2  SpO2 97%  Physical Exam  Constitutional: She is oriented to person, place, and time. She appears well-developed and well-nourished. No distress.  HENT:  Head: Normocephalic and atraumatic.  Right Ear: External ear normal.  Left Ear: External ear normal.  Eyes: Right eye exhibits no discharge. Left eye exhibits no discharge. No scleral icterus.  Cardiovascular: Normal rate and regular rhythm.   Murmur heard. Pulmonary/Chest: Effort normal and breath sounds normal. No respiratory distress. She has no wheezes. She has no rales. She exhibits no tenderness.  Musculoskeletal: She exhibits tenderness. She exhibits no edema.  Difficulty going from sitting to standing   Neurological: She is alert and oriented to person, place, and time. No cranial nerve deficit. She exhibits abnormal muscle tone. Coordination abnormal.  Using cane for ambulation  Skin: Skin is warm and dry. No rash noted. She is not diaphoretic.  Psychiatric: She has a normal mood and affect. Her behavior is normal. Judgment and thought content normal.   Assessment & Plan:   Linda Mcdaniel was seen today for establish care.  Diagnoses and all orders for this visit:  NSAID long-term use -     Comp Met (CMET)  Cold intolerance -  CBC with Differential/Platelet -     TSH -     Ferritin  Encounter to establish care  Rheumatoid arthritis involving multiple sites, unspecified rheumatoid factor presence (HCC)  Gastroesophageal reflux disease without esophagitis  Diverticulosis of colon without  hemorrhage  COPD bronchitis  Heart murmur  H/O blood clots  Rheumatic fever  Benign essential HTN  Fibromyalgia   I am having Linda Mcdaniel maintain her Multiple Vitamins-Minerals (MULTIVITAMIN ADULT PO).  Meds ordered this encounter  Medications  . Multiple Vitamins-Minerals (MULTIVITAMIN ADULT PO)    Sig: Take by mouth.     Follow-up: Return in about 6 weeks (around 05/13/2016) for Memory and pain follow up .

## 2016-04-01 NOTE — Patient Instructions (Addendum)
We will check memory next time.   Please visit the lab and if everything looks good we will send in a medication to help with pain (Diclofenac).   Welcome to [facility]! Nice to meet you.

## 2016-04-02 LAB — CBC WITH DIFFERENTIAL/PLATELET
BASOS ABS: 0.1 10*3/uL (ref 0.0–0.1)
Basophils Relative: 0.7 % (ref 0.0–3.0)
Eosinophils Absolute: 0.2 10*3/uL (ref 0.0–0.7)
Eosinophils Relative: 2.5 % (ref 0.0–5.0)
HCT: 36.3 % (ref 36.0–46.0)
Hemoglobin: 11.7 g/dL — ABNORMAL LOW (ref 12.0–15.0)
LYMPHS ABS: 2.1 10*3/uL (ref 0.7–4.0)
Lymphocytes Relative: 22.4 % (ref 12.0–46.0)
MCHC: 32.3 g/dL (ref 30.0–36.0)
MCV: 81.6 fl (ref 78.0–100.0)
MONO ABS: 0.9 10*3/uL (ref 0.1–1.0)
MONOS PCT: 9.9 % (ref 3.0–12.0)
NEUTROS ABS: 6.1 10*3/uL (ref 1.4–7.7)
NEUTROS PCT: 64.5 % (ref 43.0–77.0)
PLATELETS: 391 10*3/uL (ref 150.0–400.0)
RBC: 4.45 Mil/uL (ref 3.87–5.11)
RDW: 17.5 % — ABNORMAL HIGH (ref 11.5–15.5)
WBC: 9.4 10*3/uL (ref 4.0–10.5)

## 2016-04-02 LAB — COMPREHENSIVE METABOLIC PANEL
ALK PHOS: 90 U/L (ref 39–117)
ALT: 6 U/L (ref 0–35)
AST: 13 U/L (ref 0–37)
Albumin: 3.4 g/dL — ABNORMAL LOW (ref 3.5–5.2)
BILIRUBIN TOTAL: 0.5 mg/dL (ref 0.2–1.2)
BUN: 8 mg/dL (ref 6–23)
CO2: 25 meq/L (ref 19–32)
Calcium: 9.2 mg/dL (ref 8.4–10.5)
Chloride: 100 mEq/L (ref 96–112)
Creatinine, Ser: 0.65 mg/dL (ref 0.40–1.20)
GFR: 95.46 mL/min (ref >60.00)
GLUCOSE: 92 mg/dL (ref 70–99)
Potassium: 4.3 mEq/L (ref 3.5–5.1)
SODIUM: 133 meq/L — AB (ref 135–145)
TOTAL PROTEIN: 8.2 g/dL (ref 6.0–8.3)

## 2016-04-02 LAB — TSH: TSH: 1.89 u[IU]/mL (ref 0.35–4.50)

## 2016-04-02 LAB — FERRITIN: FERRITIN: 44.4 ng/mL (ref 10.0–291.0)

## 2016-04-06 ENCOUNTER — Encounter: Payer: Self-pay | Admitting: Nurse Practitioner

## 2016-04-08 ENCOUNTER — Encounter: Payer: Self-pay | Admitting: Nurse Practitioner

## 2016-04-08 DIAGNOSIS — M069 Rheumatoid arthritis, unspecified: Secondary | ICD-10-CM | POA: Insufficient documentation

## 2016-04-08 DIAGNOSIS — K5730 Diverticulosis of large intestine without perforation or abscess without bleeding: Secondary | ICD-10-CM | POA: Insufficient documentation

## 2016-04-08 DIAGNOSIS — R6889 Other general symptoms and signs: Secondary | ICD-10-CM | POA: Insufficient documentation

## 2016-04-08 DIAGNOSIS — R011 Cardiac murmur, unspecified: Secondary | ICD-10-CM | POA: Insufficient documentation

## 2016-04-08 DIAGNOSIS — IMO0001 Reserved for inherently not codable concepts without codable children: Secondary | ICD-10-CM | POA: Insufficient documentation

## 2016-04-08 DIAGNOSIS — M797 Fibromyalgia: Secondary | ICD-10-CM | POA: Insufficient documentation

## 2016-04-08 DIAGNOSIS — K219 Gastro-esophageal reflux disease without esophagitis: Secondary | ICD-10-CM | POA: Insufficient documentation

## 2016-04-08 DIAGNOSIS — I10 Essential (primary) hypertension: Secondary | ICD-10-CM | POA: Insufficient documentation

## 2016-04-08 DIAGNOSIS — Z86718 Personal history of other venous thrombosis and embolism: Secondary | ICD-10-CM | POA: Insufficient documentation

## 2016-04-08 DIAGNOSIS — I00 Rheumatic fever without heart involvement: Secondary | ICD-10-CM | POA: Insufficient documentation

## 2016-04-08 DIAGNOSIS — Z791 Long term (current) use of non-steroidal anti-inflammatories (NSAID): Secondary | ICD-10-CM | POA: Insufficient documentation

## 2016-04-08 DIAGNOSIS — Z7689 Persons encountering health services in other specified circumstances: Secondary | ICD-10-CM | POA: Insufficient documentation

## 2016-04-08 MED ORDER — DICLOFENAC SODIUM 50 MG PO TBEC: 50.0000 mg | DELAYED_RELEASE_TABLET | Freq: Two times a day (BID) | ORAL | Status: DC

## 2016-04-08 NOTE — Assessment & Plan Note (Addendum)
Discussed acute and chronic issues. Reviewed health maintenance measures, PFSHx, and immunizations. Obtain records from previous facility:  [facility] [place], [place]  Dr. Isidore Moos, [place]

## 2016-04-08 NOTE — Assessment & Plan Note (Signed)
Need records

## 2016-04-08 NOTE — Assessment & Plan Note (Signed)
H/o methotrexate use. PRN NSAID use  Checking labs today  Declines Rheumatology referral Thinking about Home PT and Aide

## 2016-04-08 NOTE — Assessment & Plan Note (Signed)
Stable per pt Improved with moving from [place] to [facility]

## 2016-04-08 NOTE — Assessment & Plan Note (Signed)
Unsure of last flare. Will obtain records

## 2016-04-08 NOTE — Assessment & Plan Note (Signed)
No current medications, but has "pain allover". Discussed non-pharm methods like stretching, staying as active as possible, warm showers with her shower chair she just got.

## 2016-04-08 NOTE — Assessment & Plan Note (Signed)
Checking labs today Discussed EC diclofenac if labs look good  Lab Results  Component Value Date   CREATININE 0.65 04/01/2016   BUN 8 04/01/2016   NA 133* 04/01/2016   K 4.3 04/01/2016   CL 100 04/01/2016   CO2 25 04/01/2016

## 2016-04-08 NOTE — Assessment & Plan Note (Signed)
BP Readings from Last 3 Encounters:  04/01/16 130/80   Stable currently without medications FU prn worsening/failure to improve.

## 2016-04-08 NOTE — Assessment & Plan Note (Signed)
No medications. Stable. Will obtain records. Denies trouble swallowing

## 2016-04-08 NOTE — Assessment & Plan Note (Signed)
Checking thyroid and ferritin today

## 2016-04-08 NOTE — Assessment & Plan Note (Signed)
H/o Heart Murmur resulted? Checking records

## 2016-04-08 NOTE — Assessment & Plan Note (Signed)
Resulting from Rheumatic fever?

## 2016-06-26 ENCOUNTER — Encounter: Payer: Self-pay | Admitting: Emergency Medicine

## 2016-06-26 ENCOUNTER — Emergency Department
Admission: EM | Admit: 2016-06-26 | Discharge: 2016-06-26 | Disposition: A | Discharge status: Home / Self Care | Payer: Medicare Other | Attending: Emergency Medicine | Admitting: Emergency Medicine

## 2016-06-26 DIAGNOSIS — R531 Weakness: Secondary | ICD-10-CM | POA: Diagnosis not present

## 2016-06-26 DIAGNOSIS — M1990 Unspecified osteoarthritis, unspecified site: Secondary | ICD-10-CM | POA: Diagnosis not present

## 2016-06-26 DIAGNOSIS — Z87891 Personal history of nicotine dependence: Secondary | ICD-10-CM | POA: Diagnosis not present

## 2016-06-26 DIAGNOSIS — Z8719 Personal history of other diseases of the digestive system: Secondary | ICD-10-CM | POA: Diagnosis not present

## 2016-06-26 DIAGNOSIS — M069 Rheumatoid arthritis, unspecified: Secondary | ICD-10-CM | POA: Insufficient documentation

## 2016-06-26 LAB — BASIC METABOLIC PANEL
ANION GAP: 7 (ref 5–15)
BUN: 14 mg/dL (ref 6–20)
CHLORIDE: 101 mmol/L (ref 101–111)
CO2: 28 mmol/L (ref 22–32)
Calcium: 8.9 mg/dL (ref 8.9–10.3)
Creatinine, Ser: 0.76 mg/dL (ref 0.44–1.00)
GFR calc non Af Amer: >60 mL/min (ref >60)
Glucose, Bld: 137 mg/dL — ABNORMAL HIGH (ref 65–99)
POTASSIUM: 3.8 mmol/L (ref 3.5–5.1)
Sodium: 136 mmol/L (ref 135–145)

## 2016-06-26 LAB — URINALYSIS COMPLETE WITH MICROSCOPIC (ARMC ONLY)
BILIRUBIN URINE: NEGATIVE
Bacteria, UA: NONE SEEN
Glucose, UA: 50 mg/dL — AB
KETONES UR: NEGATIVE mg/dL
Leukocytes, UA: NEGATIVE
NITRITE: NEGATIVE
PROTEIN: NEGATIVE mg/dL
SPECIFIC GRAVITY, URINE: 1.02 (ref 1.005–1.030)
pH: 7 (ref 5.0–8.0)

## 2016-06-26 LAB — CBC
HCT: 37.6 % (ref 35.0–47.0)
HEMOGLOBIN: 12.5 g/dL (ref 12.0–16.0)
MCH: 28.4 pg (ref 26.0–34.0)
MCHC: 33.1 g/dL (ref 32.0–36.0)
MCV: 85.7 fL (ref 80.0–100.0)
PLATELETS: 269 10*3/uL (ref 150–440)
RBC: 4.39 MIL/uL (ref 3.80–5.20)
RDW: 15.9 % — ABNORMAL HIGH (ref 11.5–14.5)
WBC: 9.9 10*3/uL (ref 3.6–11.0)

## 2016-06-26 NOTE — ED Notes (Signed)
Pt sleeping in bed no distress noted. Will continue to monitor. Daughter at the bedside.

## 2016-06-26 NOTE — ED Notes (Signed)
CSW at bedside.

## 2016-06-26 NOTE — ED Notes (Signed)
Pt resting in bed, eyes closed, resp even and unlabored, daughter at bedside

## 2016-06-26 NOTE — Clinical Social Work Note (Signed)
Clinical Social Work Assessment  Patient Details  Name: Linda Mcdaniel MRN: [id number] Date of Birth: 07-20-1946  Date of referral:  06/26/16               Reason for consult:  Insurance Barriers, Nutritional therapist, [employer], Family Concerns                Permission sought to share information with:    Permission granted to share information::     Name::        Agency::     Relationship::     Contact Information:     Housing/Transportation Living arrangements for the past 2 months:  Single Family Home Source of Information:  Patient, Other (Comment Required) (Granddaughter Engineer, maintenance (IT) ) Patient Interpreter Needed:  None Criminal Activity/Legal Involvement Pertinent to Current Situation/Hospitalization:  No - Comment as needed Significant Relationships:  Adult Children, Other Family Members Lives with:  Adult Children Do you feel safe going back to the place where you live?  Yes Need for family participation in patient care:  Yes (Comment)  Care giving concerns: Patient is currently staying with her daughter Linda Mcdaniel [phone] in [place].    Social Worker assessment / plan:  Holiday representative (CSW) received consult for SNF placement concerns. Per MD patient does not meet criteria to be admitted to the hospital. CSW met with patient and her granddaughter Linda Mcdaniel [phone] was at bedside. CSW introduced self and explained role of CSW department. Per Linda Mcdaniel patient moved to [facility] from [place] to be close to family and was living alone in an apartment in [place]. Per Linda Mcdaniel the last 2 weeks patient's health has declined and she has not been able to get up and go to the bathroom and perform her ADL's herself so she has been staying with her daughter Linda Mcdaniel. CSW explained that patient will not be able to be placed to a SNF from the ED because she has Medicare only, which requires a 3 night qualfying inpatient stay at a hospital in order for Medicare to cover SNF. CSW explained  that patient can return with home health and private duty sitters. Linda Mcdaniel chose [facility] and a private duty sitter list was provided. CSW also gave [facility] and ALF list. CSW also discussed long term care options. Per Linda Mcdaniel patient has a pending Medicaid application in [place]. CSW also gave Linda Mcdaniel information for PACE. Patient and granddaughter are agreeable for patient to go home today. Granddaughter will transport patient home today. RN case Freight forwarder, RN and MD aware of above. Please reconsult if future social work needs arise. CSW signing off.     Employment status:  Disabled (Comment on whether or not currently receiving Disability), Retired Forensic scientist:  Medicare PT Recommendations:  Not assessed at this time Information / Referral to community resources:  Other (Comment Required) (Medicaid and PACE resources )  Patient/Family's Response to care:  Patient and granddaughter are agreeable for patient to return home today.   Patient/Family's Understanding of and Emotional Response to Diagnosis, Current Treatment, and Prognosis:  Patient and granddaughter were pleasant and thanked CSW for visit.   Emotional Assessment Appearance:  Appears stated age Attitude/Demeanor/Rapport:    Affect (typically observed):  Accepting, Adaptable, Pleasant Orientation:  Oriented to Self, Oriented to Place, Oriented to  Time, Oriented to Situation Alcohol / Substance use:  Not Applicable Psych involvement (Current and /or in the community):  No (Comment)  Discharge Needs  Concerns to be addressed:  Discharge  Planning Concerns Readmission within the last 30 days:  No Current discharge risk:  Dependent with Mobility Barriers to Discharge:  No Barriers Identified   Loralyn Freshwater, LCSW 06/26/2016, 3:58 PM

## 2016-06-26 NOTE — ED Provider Notes (Signed)
[facility] Emergency Department Provider Note  ____________________________________________  Time seen: 3:30AM  I have reviewed the triage vital signs and the nursing notes.   HISTORY  Chief Complaint Knee Pain and Weakness     HPI Laneice Meneely is a 70 y.o. female with history of rheumatoid arthritis presents with generalized weakness with inability to perform ADLs. Unable to ambulate with walker progressively since January but worse over the last 2 weeks. Patient states that she wants to remain living at home and a such would like to be involved in the pace program. Patient states that she is not currently eroded second due to the fact that she does not have Medicaid/Medicare. Patient denies any recent illness no nausea no vomiting and diarrhea.     Past Medical History  Diagnosis Date  . Arthritis   . Chicken pox   . GERD (gastroesophageal reflux disease)   . Diverticulitis   . Heart murmur   . Rheumatic fever     Patient Active Problem List   Diagnosis Date Noted  . NSAID long-term use 04/08/2016  . Cold intolerance 04/08/2016  . Encounter to establish care 04/08/2016  . Rheumatoid arthritis (HCC) 04/08/2016  . GERD (gastroesophageal reflux disease) 04/08/2016  . Diverticulosis of colon without hemorrhage 04/08/2016  . 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 Heart murmur 04/08/2016  . H/O blood clots 04/08/2016  . Rheumatic fever 04/08/2016  . Benign essential HTN 04/08/2016  . Fibromyalgia 04/08/2016    Past Surgical History  Procedure Laterality Date  . Cesarean section  1965  . Cesarean section  1978    No current outpatient prescriptions on file.  Allergies Lorabid and Penicillins  Family History  Problem Relation Age of Onset  . Alcohol abuse Mother   . Heart disease Mother   . Hypertension Mother   . Stroke Mother   . Alcohol abuse Father   . Diabetes Sister   . Alcohol abuse Brother   . Arthritis Maternal Grandmother   .  Stroke Maternal Grandmother     Social History Social History  Substance Use Topics  . Smoking status: Former Games developer  . Smokeless tobacco: Never Used  . Alcohol Use: 0.0 oz/week    0 Standard drinks or equivalent per week    Review of Systems  Constitutional: Negative for fever. Eyes: Negative for visual changes. ENT: Negative for sore throat. Cardiovascular: Negative for chest pain. Respiratory: Negative for shortness of breath. Gastrointestinal: Negative for abdominal pain, vomiting and diarrhea. Genitourinary: Negative for dysuria. Musculoskeletal: Negative for back pain. Skin: Negative for rash. Neurological: Positive for generalized weakness   10-point ROS otherwise negative.  ____________________________________________   PHYSICAL EXAM:  VITAL SIGNS: ED Triage Vitals  Enc Vitals Group     BP 06/26/16 0018 149/68 mmHg     Pulse Rate 06/26/16 0018 96     Resp 06/26/16 0018 18     Temp 06/26/16 0018 97.3 F (36.3 C)     Temp Source 06/26/16 0018 Oral     SpO2 06/26/16 0018 96 %     Weight 06/26/16 0018 145 lb (65.772 kg)     Height 06/26/16 0018 5' (1.524 m)     Head Cir --      Peak Flow --      Pain Score 06/26/16 0019 8     Pain Loc --      Pain Edu? --      Excl. in GC? --      Constitutional: Alert and  oriented. Well appearing and in no distress. Eyes: Conjunctivae are normal. PERRL. Normal extraocular movements. ENT   Head: Normocephalic and atraumatic.   Nose: No congestion/rhinnorhea.   Mouth/Throat: Mucous membranes are moist.   Neck: No stridor. Hematological/Lymphatic/Immunilogical: No cervical lymphadenopathy. Cardiovascular: Normal rate, regular rhythm. Normal and symmetric distal pulses are present in all extremities. No murmurs, rubs, or gallops. Respiratory: Normal respiratory effort without tachypnea nor retractions. Breath sounds are clear and equal bilaterally. No wheezes/rales/rhonchi. Gastrointestinal: Soft and  nontender. No distention. There is no CVA tenderness. Genitourinary: deferred Musculoskeletal: Nontender with normal range of motion in all extremities. No joint effusions.  No lower extremity tenderness nor edema. Neurologic:  Normal speech and language. No gross focal neurologic deficits are appreciated. Speech is normal.  Skin:  Skin is warm, dry and intact. No rash noted. Psychiatric: Mood and affect are normal. Speech and behavior are normal. Patient exhibits appropriate insight and judgment.  ____________________________________________    LABS (pertinent positives/negatives)  Labs Reviewed  BASIC METABOLIC PANEL - Abnormal; Notable for the following:    Glucose, Bld 137 (*)    All other components within normal limits  CBC - Abnormal; Notable for the following:    RDW 15.9 (*)    All other components within normal limits  URINALYSIS COMPLETEWITH MICROSCOPIC ([facility] ONLY) - Abnormal; Notable for the following:    Color, Urine YELLOW (*)    APPearance CLOUDY (*)    Glucose, UA 50 (*)    Hgb urine dipstick 1+ (*)    Squamous Epithelial / LPF 6-30 (*)    All other components within normal limits     ____________________________________________   EKG  ED ECG REPORT I, [place] N BROWN, the attending physician, personally viewed and interpreted this ECG.   Date: 06/26/2016  EKG Time: 12:29 AM  Rate: 95  Rhythm: Normal sinus rhythm  Axis: Normal  Intervals: Normal  ST&T Change: None      INITIAL IMPRESSION / ASSESSMENT AND PLAN / ED COURSE  Pertinent labs & imaging results that were available during my care of the patient were reviewed by me and considered in my medical decision making (see chart for details).  Await social work consultation  ____________________________________________   FINAL CLINICAL IMPRESSION(S) / ED DIAGNOSES  Final diagnoses:  Generalized weakness      Darci Current, MD 06/26/16 [phone]

## 2016-06-26 NOTE — ED Notes (Signed)
MD at bedside. 

## 2016-06-26 NOTE — ED Notes (Signed)
Pt presents to ED with bilateral knee pain and weakness. Pt reports hx of the same and states its been getting worse for the past year and has become "dramatically worse the past 2 weeks". Pt has been staying with family for the past several days and they noticed pt having difficulty standing from toilet and needing assistance to ambulate. Today pt needed assistance just to sit up. Pt alert and calm. Able to answer questions without difficulty. Granddaughter states pt has been fearful of falling and seems more fatigued lately.

## 2016-06-26 NOTE — ED Notes (Signed)
Pt placed back in bed, pt had moderate solid BM. Pt given ginger ale and warm blankets. Pt reports that she did eat donut for breakfast brought by family member. Pt unable to stand and pivot by herself, required 1 person assist.

## 2016-06-26 NOTE — ED Notes (Signed)
Report given to Olivia RN.

## 2016-06-26 NOTE — ED Notes (Signed)
Pt alert and oriented X4, active, cooperative, pt in NAD. RR even and unlabored, color WNL.  Pt informed to return if any life threatening symptoms occur.   

## 2016-06-26 NOTE — ED Notes (Addendum)
Patient presents to ED with daughter c/o bilateral knee weakness and pain, pt daughter reports pain has rheumatoid arthritis and has been out of her RA medications for 10 months. Daughter reports for the past 2 weeks pt has been having increased weakness and has not been able to stand for long, pt is normally independent and able to ambulate independently. Daughter reports pt is living with her at this time, but reports needs assistance with taking care of mother. Daughter states pt is having "memory problems" states pt has ran out of pain medication for 4 months and is unsure if she is taking medications correctly. Pt is alert and oriented, respirations even and unlabored. Skin warm and dry.

## 2016-06-26 NOTE — Care Management Note (Signed)
Case Management Note  Patient Details  Name: Linda Mcdaniel MRN: [id number] Date of Birth: 23-Jan-1946  Subjective/Objective:   Spoke to patient and daughter at bedside. They ar eapplying for medicaid , but are interested in PACE. I have explained that the doctor found no acute information requiring admission to hospital. I also explained tht the pt. Has Medicare which would cover [facility] PT, nursing, but they are interested in an aide for the pt. To help with ADL's and ambulation. IThe daughter wants reinforcement of the fact they are not meeting admission rules. I have talked to Dr Inocencio Homes and she will speak to the pt. And daughter. I left both [employer] brochure , and the information for PACE with the daughter. I have also spoken to her about the fact that companion/aide care can be paid for out of pocket for folks like them with Medicare only in place. The patient adamanatly says she has no funds to do that. I will let the CSW know I have seen these folks.                 Action/Plan:   Expected Discharge Date:                  Expected Discharge Plan:     In-House Referral:     Discharge planning Services     Post Acute Care Choice:    Choice offered to:     DME Arranged:    DME Agency:     HH Arranged:    HH Agency:     Status of Service:     If discussed at [employer] of Stay Meetings, dates discussed:    Additional Comments:  Berna Bue, RN 06/26/2016, 8:46 AM

## 2016-06-26 NOTE — ED Notes (Signed)
Patient assisted to [facility] with Zollie Scale, RN. Patient's family member in room. Patient states she will call out when she is finished.

## 2016-06-26 NOTE — ED Provider Notes (Signed)
-----------------------------------------   9:42 AM on 06/26/2016 -----------------------------------------  Care was assumed from Dr. Manson Passey at 7 AM pending social work consult. The patient is comfortable and in no acute distress. Berna Bue of case management has spoken with the patient and with her family members. As the patient is awaiting Medicaid, the family will either have to self pay for a nurses aide or they can wait for Medicaid to come through so they can formally enroll in the pace program which they are interested in doing. They should qualify for home health and physical therapy and we discussed this with them. I discussed the case with Fredric Mare of social work who will evaluate the patient. The patient is not interested in being placed at an actual facility and we discussed that self pay at an assisted living facility can be quite expensive and it does not appear that they would have the resources for that. Fortunately, the patient does have a follow-up appointment scheduled with a primary care doctor on 07/01/2016. I encouraged them to keep this appointment.  ----------------------------------------- 11:16 AM on 06/26/2016 ----------------------------------------- CSW Fredric Mare has spoken with the patient and her family and offered outpatient resources. I ordered PT, home health, nurses aide evaluation. I provided a prescription for bedside commode. The patient and her family are comfortable with the discharge plan. DC home with return precautions.   Gayla Doss, MD 06/26/16 1122

## 2016-06-26 NOTE — ED Notes (Signed)
Report from Olivia, RN

## 2016-06-27 LAB — URINE CULTURE

## 2016-07-01 ENCOUNTER — Ambulatory Visit: Payer: Medicare Other | Admitting: Family Medicine

## 2016-07-01 ENCOUNTER — Telehealth: Payer: Self-pay | Admitting: Family Medicine

## 2016-07-01 ENCOUNTER — Ambulatory Visit (INDEPENDENT_AMBULATORY_CARE_PROVIDER_SITE_OTHER): Payer: Medicare Other | Admitting: Family Medicine

## 2016-07-01 ENCOUNTER — Ambulatory Visit
Admission: RE | Admit: 2016-07-01 | Discharge: 2016-07-01 | Disposition: A | Discharge status: Home / Self Care | Payer: Medicare Other | Source: Ambulatory Visit | Attending: Family Medicine | Admitting: Family Medicine

## 2016-07-01 VITALS — BP 130/68 | HR 101 | Temp 98.7°F | Ht <= 58 in

## 2016-07-01 DIAGNOSIS — M545 Low back pain, unspecified: Secondary | ICD-10-CM

## 2016-07-01 DIAGNOSIS — R29898 Other symptoms and signs involving the musculoskeletal system: Secondary | ICD-10-CM | POA: Diagnosis not present

## 2016-07-01 DIAGNOSIS — M4806 Spinal stenosis, lumbar region: Secondary | ICD-10-CM | POA: Diagnosis not present

## 2016-07-01 DIAGNOSIS — M5136 Other intervertebral disc degeneration, lumbar region: Secondary | ICD-10-CM | POA: Insufficient documentation

## 2016-07-01 DIAGNOSIS — Z130 Encounter for screening for diseases of the blood and blood-forming organs and certain disorders involving the immune mechanism: Secondary | ICD-10-CM | POA: Diagnosis not present

## 2016-07-01 LAB — CK: CK TOTAL: 24 U/L (ref 7–177)

## 2016-07-01 LAB — VITAMIN B12: VITAMIN B 12: 147 pg/mL — AB (ref 211–911)

## 2016-07-01 LAB — SEDIMENTATION RATE: Sed Rate: 48 mm/hr — ABNORMAL HIGH (ref 0–30)

## 2016-07-01 LAB — FOLATE: FOLATE: 3.3 ng/mL — AB (ref >5.9)

## 2016-07-01 MED ORDER — DICLOFENAC SODIUM 75 MG PO TBEC: 75.0000 mg | DELAYED_RELEASE_TABLET | Freq: Two times a day (BID) | ORAL | Status: DC | PRN

## 2016-07-01 NOTE — Patient Instructions (Signed)
Nice to meet you. We are going to get an MRI of her low back to evaluate your pain and weakness in her legs. We will obtain some lab work as well. If you develop worsening pain or he develop fevers, weakness, or any new or change in symptoms please seek medical attention.

## 2016-07-01 NOTE — Progress Notes (Signed)
Patient ID: Linda Mcdaniel, female   DOB: 06-29-1946, 70 y.o.   MRN: [id number]  Linda Rumps, MD Phone: [phone]  Linda Mcdaniel is a 70 y.o. female who presents today for follow-up.  Patient notes last week she went to the emergency room for progressive generalized weakness. Notes she has been weak over the last several months that progressively worsened over the last several weeks. Progressed to the point where she is unable to stand up on her own. Once up she is able to take a few steps on her own. She notes some mild back discomfort over the last several days with this. Also notes over the last several weeks having difficulty telling when she needs to go to the bathroom and urinating on herself relatively frequently. In the emergency room she had basic lab work that was relatively unremarkable with no apparent cause. They had a Education officer, museum come see her in the emergency room and try to arrange for home health with the patient. The daughter complains that she is just unable to care for her mother and cannot provide the care that is needed at this time. Notes things are not happening quickly enough and that home health has not been able to come out quickly enough though they're planning to come out this week. She notes no saddle anesthesia, fevers, or history of cancer. Patient's daughter does report some issues with dementia. Notes short-term memory is not what it used to be. Long-term memory appears intact per their report. No mental status changes. She notes no other neurological abnormalities.  PMH: Former smoker   ROS see history of present illness  Objective  Physical Exam Filed Vitals:   07/01/16 1251  BP: 130/68  Pulse: 101  Temp: 98.7 F (37.1 C)    BP Readings from Last 3 Encounters:  07/01/16 130/68  06/26/16 117/48  04/01/16 130/80   Wt Readings from Last 3 Encounters:  06/26/16 145 lb (65.772 kg)  04/01/16 142 lb 12 oz (64.751 kg)    Physical Exam    Constitutional: No distress.  HENT:  Head: Normocephalic and atraumatic.  Right Ear: External ear normal.  Left Ear: External ear normal.  Mouth/Throat: Oropharynx is clear and moist. No oropharyngeal exudate.  Eyes: Conjunctivae are normal. Pupils are equal, round, and reactive to light.  Cardiovascular: Normal rate, regular rhythm and normal heart sounds.   Pulmonary/Chest: Effort normal and breath sounds normal.  Musculoskeletal:  No midline spine tenderness, no midline spine step-off, bilateral lumbar spine muscular tenderness with no skin changes, bilateral knees with bony deformity and mild tenderness, no ligamentous laxity  Neurological: She is alert.  CN 2-12 intact, 5/5 strength in bilateral biceps, triceps, grip, quads, hamstrings, plantar and dorsiflexion, sensation to light touch intact in bilateral UE and LE, unable to recall what she had for dinner last night  Skin: Skin is warm and dry. She is not diaphoretic.     Assessment/Plan: Please see individual problem list.  Weakness of both lower extremities Patient reports progressive inability to stand up on her own. This is a pretty big change from previously. She does appear to have intact strength when sitting down though unable to stand up. She reports recent onset low back pain. Has also had some urinary incontinence that is new as well. Reports one episode of bowel incontinence as well. Overall very concerning that she is unable to stand up. The fact that she has had lower extremity weakness with back pain and incontinence issues makes one wonder  if there is a compressive cause in her lumbar spine such as cauda equina. Given this concern stat MRI will be ordered to rule this out. She does have a history of anemia and thus we will screen for other potential causes of lower extremity issues with folate and B12 particularly given her report that her diet is quite poor and she is unsure how often she eats. She is appropriate in the  office and alert. If MRI negative would consider neurology referral. We'll have our referral coordinator work with [facility] to figure out what services are being provided at home to see if anything additional can be provided. Patient is given return precautions.    Orders Placed This Encounter  Procedures  . MR Lumbar Spine Wo Contrast    Hold patient    Standing Status: Future     Number of Occurrences: 1     Standing Expiration Date: 09/01/2017    Order Specific Question:  Reason for Exam (SYMPTOM  OR DIAGNOSIS REQUIRED)    Answer:  recent low back pain, urinary incontinence over the past 2 weeks    Order Specific Question:  Preferred imaging location?    Answer:  [facility] (table limit-300lbs)    Order Specific Question:  What is the patient's sedation requirement?    Answer:  No Sedation    Order Specific Question:  Does the patient have a pacemaker or implanted devices?    Answer:  No    Order Specific Question:  Call Results- Best Contact Number?    Answer:  [phone]  . B12  . Folate  . CK (Creatine Kinase)  . Sed Rate (ESR)    No orders of the defined types were placed in this encounter.    Linda Rumps, MD [place]

## 2016-07-01 NOTE — Progress Notes (Signed)
Pre visit review using our clinic review tool, if applicable. No additional management support is needed unless otherwise documented below in the visit note. 

## 2016-07-01 NOTE — Telephone Encounter (Signed)
Spoke with patient's daughter regarding MRI results. Advised there was mild degenerative changes. Nothing to explain her weakness. Discussed awaiting lab results and having home health out to the house. Discussed neurology referral as well. This was placed. We will treat the patient's discomfort in her knees with diclofenac. Daughter stated this was previously mentioned. She initially mentioned Norco as the patient had been on this in the past though I discussed if that was a chronic medicine she would need to see pain management. Diclofenac was sent to the pharmacy.

## 2016-07-02 ENCOUNTER — Encounter: Payer: Self-pay | Admitting: Family Medicine

## 2016-07-02 DIAGNOSIS — R29898 Other symptoms and signs involving the musculoskeletal system: Secondary | ICD-10-CM | POA: Insufficient documentation

## 2016-07-02 NOTE — Assessment & Plan Note (Signed)
Patient reports progressive inability to stand up on her own. This is a pretty big change from previously. She does appear to have intact strength when sitting down though unable to stand up. She reports recent onset low back pain. Has also had some urinary incontinence that is new as well. Reports one episode of bowel incontinence as well. Overall very concerning that she is unable to stand up. The fact that she has had lower extremity weakness with back pain and incontinence issues makes one wonder if there is a compressive cause in her lumbar spine such as cauda equina. Given this concern stat MRI will be ordered to rule this out. She does have a history of anemia and thus we will screen for other potential causes of lower extremity issues with folate and B12 particularly given her report that her diet is quite poor and she is unsure how often she eats. She is appropriate in the office and alert. If MRI negative would consider neurology referral. We'll have our referral coordinator work with [facility] to figure out what services are being provided at home to see if anything additional can be provided. Patient is given return precautions.

## 2016-07-25 ENCOUNTER — Telehealth: Payer: Self-pay | Admitting: Family Medicine

## 2016-07-25 NOTE — Telephone Encounter (Signed)
Noted. Patient should monitor for any new symptoms following her fall and if these develop she should be evaluated. Thanks.

## 2016-07-25 NOTE — Telephone Encounter (Signed)
[facility] from [place], [phone]. Pt had a fall, no injuries, missed the chair and fell. Pt ok. She went on with therapy. No complaints.

## 2016-07-25 NOTE — Telephone Encounter (Signed)
Noted thanks °

## 2016-07-25 NOTE — Telephone Encounter (Signed)
FYI

## 2016-08-01 ENCOUNTER — Telehealth: Payer: Self-pay | Admitting: Family Medicine

## 2016-08-01 ENCOUNTER — Ambulatory Visit (INDEPENDENT_AMBULATORY_CARE_PROVIDER_SITE_OTHER): Payer: Medicare Other | Admitting: Family Medicine

## 2016-08-01 VITALS — BP 126/68 | HR 101 | Temp 97.9°F

## 2016-08-01 DIAGNOSIS — M25561 Pain in right knee: Secondary | ICD-10-CM | POA: Diagnosis not present

## 2016-08-01 DIAGNOSIS — R29898 Other symptoms and signs involving the musculoskeletal system: Secondary | ICD-10-CM

## 2016-08-01 DIAGNOSIS — E538 Deficiency of other specified B group vitamins: Secondary | ICD-10-CM | POA: Diagnosis not present

## 2016-08-01 DIAGNOSIS — M25562 Pain in left knee: Secondary | ICD-10-CM

## 2016-08-01 MED ORDER — CYANOCOBALAMIN 1000 MCG/ML IJ SOLN: 1000.0000 ug | INTRAMUSCULAR | 0 refills | Status: DC

## 2016-08-01 MED ORDER — CYANOCOBALAMIN 1000 MCG/ML IJ SOLN
1000.0000 ug | Freq: Once | INTRAMUSCULAR | Status: AC
  Administered 2016-08-01: 1000 ug via INTRAMUSCULAR

## 2016-08-01 MED ORDER — FOLIC ACID 1 MG PO TABS: 1.0000 mg | ORAL_TABLET | Freq: Every day | ORAL | 1 refills | Status: AC

## 2016-08-01 NOTE — Telephone Encounter (Signed)
Linda Mcdaniel [phone] called from [facility] regarding to inform that pt not home yesterday for visit. Thank you!

## 2016-08-01 NOTE — Patient Instructions (Addendum)
Nice to see you. Please continue physical therapy. We will have evaluated by neurology. Please keep the appointment with them. If you do not hear anything regarding social work in the next week please let us know. If you develop change in strength, numbness, numbness between her legs, or any new or changing symptoms please seek medical attention.

## 2016-08-01 NOTE — Telephone Encounter (Signed)
Noted, thanks!

## 2016-08-01 NOTE — Progress Notes (Signed)
Marikay Alar, MD Phone: [phone]  Linda Mcdaniel is a 70 y.o. female who presents today for a follow-up  Patient reports she is about the same as last time. She does note she is standing easier. Her daughter notes that physical therapy has been working with her and if they just hold the back of her trousers she can stand with no help. Otherwise she is afraid to fall and has trouble standing due to fear of falling. Still having intermittent bowel movements and urination without realizing it. She had an MRI of her lumbar spine at her last visit that had no obvious cause for this. Some days she does get up on her own. About 50% of time she realizes she is having bowel movements and urination. Occasional low back discomfort that is stable. Knee pain is stable. Diclofenac has been helpful. Daughter does note some short-term memory issues and that she had workup for dementia previously that revealed that she was in the early stages of dementia. She has an appointment with neurology in late September. They have not heard anything regarding social work. She was also B-12 deficient. Doubt this is leading significantly to her issues though she is due for supplementation today.  Past medical history: History of dementia  ROS see history of present illness  Objective  Physical Exam Vitals:   08/01/16 1556  BP: 126/68  Pulse: (!) 101  Temp: 97.9 F (36.6 C)    BP Readings from Last 3 Encounters:  08/01/16 126/68  07/01/16 130/68  06/26/16 (!) 117/48   Wt Readings from Last 3 Encounters:  06/26/16 145 lb (65.8 kg)  04/01/16 142 lb 12 oz (64.8 kg)    Physical Exam  Constitutional: No distress.  HENT:  Head: Normocephalic and atraumatic.  Mouth/Throat: Oropharynx is clear and moist.  Eyes: Conjunctivae are normal. Pupils are equal, round, and reactive to light.  Cardiovascular: Normal rate, regular rhythm and normal heart sounds.   Pulmonary/Chest: Effort normal and breath sounds  normal.  Musculoskeletal: She exhibits no edema.  Bilateral knees with bony enlargement, no swelling, warmth, or erythema, nontender joint lines, no ligamentous laxity, negative McMurray's  Neurological: She is alert.  CN 2-12 intact, 5/5 strength in bilateral biceps, triceps, grip, quads, hamstrings, plantar and dorsiflexion, sensation to light touch intact in bilateral UE and LE, 2+ patellar reflexes, she is able to stand on her own with no assistance, she does need to press up somewhat with her hands to do this, patient passed mini cog  Skin: Skin is warm and dry. She is not diaphoretic.     Assessment/Plan: Please see individual problem list.  Weakness of both lower extremities Appears to be somewhat improving. She is able to stand on her own now. Neurologically intact today. I wonder if some of this is fear induced related to falling though she reports no falls. She receives minimal support from PT and is able to stand on her own with no issues with this minimal support. Some of this could be related to dementia. No obvious cause for her bowel or urinary issues on her MRI. This could be related to possible dementia. We will have her see neurology as scheduled and proceed from there. We will additionally have our referral coordinator check with social work regarding any additional help they can get at home.  Vitamin B 12 deficiency Patient is B-12 deficient on her last check. This potentially could be playing a part in her weakness though this would seem less likely. She  was given her first B12 injection today. She is given a prescription for B12 injections to be performed at home by her home health nurse.  Knee pain, bilateral Chronic issue. Suspect related to arthritic changes possibly osteoarthritis or rheumatoid arthritis. We will have her continue diclofenac as needed.   No orders of the defined types were placed in this encounter.   Meds ordered this encounter  Medications  .  cyanocobalamin ((VITAMIN B-12)) injection 1,000 mcg  . cyanocobalamin (,VITAMIN B-12,) 1000 MCG/ML injection    Sig: Inject 1 mL (1,000 mcg total) into the muscle once a week. Starting 08/08/16    Dispense:  3 mL    Refill:  0  . folic acid (FOLVITE) 1 MG tablet    Sig: Take 1 tablet (1 mg total) by mouth daily.    Dispense:  90 tablet    Refill:  1    Marikay Alar, MD [facility]

## 2016-08-01 NOTE — Assessment & Plan Note (Signed)
Chronic issue. Suspect related to arthritic changes possibly osteoarthritis or rheumatoid arthritis. We will have her continue diclofenac as needed.

## 2016-08-01 NOTE — Assessment & Plan Note (Signed)
Patient is B-12 deficient on her last check. This potentially could be playing a part in her weakness though this would seem less likely. She was given her first B12 injection today. She is given a prescription for B12 injections to be performed at home by her home health nurse.

## 2016-08-01 NOTE — Progress Notes (Signed)
Pre visit review using our clinic review tool, if applicable. No additional management support is needed unless otherwise documented below in the visit note. 

## 2016-08-01 NOTE — Assessment & Plan Note (Signed)
Appears to be somewhat improving. She is able to stand on her own now. Neurologically intact today. I wonder if some of this is fear induced related to falling though she reports no falls. She receives minimal support from PT and is able to stand on her own with no issues with this minimal support. Some of this could be related to dementia. No obvious cause for her bowel or urinary issues on her MRI. This could be related to possible dementia. We will have her see neurology as scheduled and proceed from there. We will additionally have our referral coordinator check with social work regarding any additional help they can get at home.

## 2016-08-07 ENCOUNTER — Telehealth: Payer: Self-pay | Admitting: Family Medicine

## 2016-08-07 NOTE — Telephone Encounter (Signed)
FYI, thanks.

## 2016-08-07 NOTE — Telephone Encounter (Signed)
[place] called from [facility] regarding pt had a fall at house on today slipped out of chair no injuries. Just wanted to make known.  Thank You!

## 2016-08-07 NOTE — Telephone Encounter (Signed)
Noted. If she develops any issues from this fall she should be evaluated in the office.

## 2016-08-08 NOTE — Telephone Encounter (Signed)
Notified thanks.

## 2016-08-12 ENCOUNTER — Telehealth: Payer: Self-pay | Admitting: Family Medicine

## 2016-08-12 DIAGNOSIS — R29898 Other symptoms and signs involving the musculoskeletal system: Secondary | ICD-10-CM

## 2016-08-12 NOTE — Telephone Encounter (Signed)
Lawson Fiscal, Ms. Rhodes daughter called and asked for a return call in regards to her mother. She wouldn't go into specifics or say why. Please give her a call if possible.  Lori's ph# [phone] Thank you.

## 2016-08-13 ENCOUNTER — Telehealth: Payer: Self-pay | Admitting: Family Medicine

## 2016-08-13 NOTE — Telephone Encounter (Signed)
Are you okay with having a verbal given?

## 2016-08-13 NOTE — Telephone Encounter (Signed)
Misty Stanley from [facility] called asking for verbal orders to continue in home PT. Please call Misty Stanley at [phone].

## 2016-08-14 NOTE — Telephone Encounter (Signed)
I am unsure what has happened with this. I have replaced the home health order and will forward to [facility] to see about getting home health social worker set up.

## 2016-08-14 NOTE — Telephone Encounter (Signed)
Verbal given to [facility]

## 2016-08-14 NOTE — Telephone Encounter (Signed)
Verbal order can be given.  

## 2016-08-14 NOTE — Telephone Encounter (Signed)
Spoke with patients daughter and she was very upset because she has left several messages for the referrals with not getting a call back about her mothers referral for social worker. She tried to call twice last week and then has tried to call a couple times this week to speak with the office manger about the phone systems. She stated that there has been 2 referrals placed and has not had anything done yet. I told her that I would try to find out what is going on and get back with her as soon as I can find out something. She said that the order needs to be faxed to [facility] attention Noreene Larsson to fax number [phone].

## 2016-08-15 ENCOUNTER — Telehealth: Payer: Self-pay | Admitting: Family Medicine

## 2016-08-15 NOTE — Telephone Encounter (Signed)
Pt daughter called stating she is returning your call. Thank you!

## 2016-08-15 NOTE — Telephone Encounter (Signed)
Notified patients daughter of Dr. Kermit Balo message

## 2016-08-15 NOTE — Telephone Encounter (Signed)
LM for patient to return call.

## 2016-08-29 ENCOUNTER — Telehealth: Payer: Self-pay | Admitting: Family Medicine

## 2016-08-29 NOTE — Telephone Encounter (Signed)
Please advise, thanks.

## 2016-08-29 NOTE — Telephone Encounter (Signed)
Stacey [phone] called from [facility] regarding to see if Dr Birdie Sons would agree to have PT continue in the home. Please advise to have continued PT due to pt being moved upstairs and further needs. Verbal order. Thank you!

## 2016-08-30 NOTE — Telephone Encounter (Signed)
Left a detailed message with verbal order. thanks

## 2016-08-30 NOTE — Telephone Encounter (Signed)
Verbal order can be given to continue PT.

## 2016-09-04 ENCOUNTER — Other Ambulatory Visit: Payer: Self-pay | Admitting: Family Medicine

## 2016-09-05 ENCOUNTER — Encounter: Payer: Self-pay | Admitting: Family Medicine

## 2016-09-12 ENCOUNTER — Other Ambulatory Visit: Payer: Self-pay | Admitting: Family Medicine

## 2016-09-12 DIAGNOSIS — M069 Rheumatoid arthritis, unspecified: Secondary | ICD-10-CM

## 2016-09-15 ENCOUNTER — Other Ambulatory Visit: Payer: Self-pay | Admitting: Family Medicine

## 2016-09-17 ENCOUNTER — Encounter: Payer: Self-pay | Admitting: Family Medicine

## 2016-09-19 ENCOUNTER — Telehealth: Payer: Self-pay | Admitting: Family Medicine

## 2016-09-19 NOTE — Telephone Encounter (Signed)
Noted  

## 2016-09-19 NOTE — Telephone Encounter (Signed)
FYI, thanks.

## 2016-09-19 NOTE — Telephone Encounter (Signed)
Velna Hatchet [phone] called from [facility] regarding pt daughter cancelled nursing visit on 09/18/16 and appt will be next week on 09/25/16. Thank you!

## 2016-09-26 ENCOUNTER — Telehealth: Payer: Self-pay | Admitting: *Deleted

## 2016-09-26 NOTE — Telephone Encounter (Signed)
FYI

## 2016-09-26 NOTE — Telephone Encounter (Signed)
Noted  

## 2016-09-26 NOTE — Telephone Encounter (Signed)
Linda Mcdaniel from [facility] stated that pt will have discontinued nurse serves, she will continue occupational therapy for the next couple of weeks.

## 2016-10-02 ENCOUNTER — Telehealth: Payer: Self-pay

## 2016-10-02 NOTE — Telephone Encounter (Signed)
Spoke to daughter Lawson Fiscal. Explained previous note (denial). Daughter verbalized understanding.  Daughter stated they have already paid and picked med up. Will speak w/ pharmacy about Medicare Part D eligibility when refill needed.

## 2016-10-02 NOTE — Telephone Encounter (Signed)
Called daughter to advise PA denied through Medicaid/ [place] for Diclofenac Sodium b/c patient eligible for Medicare part D on 10/30/2016.  Notified pharmacy.

## 2016-10-04 ENCOUNTER — Telehealth: Payer: Self-pay | Admitting: Family Medicine

## 2016-10-04 NOTE — Telephone Encounter (Signed)
Returned Stacey's call left vmail advising ok to make up visit  per Dr. Birdie Sons.

## 2016-10-04 NOTE — Telephone Encounter (Signed)
Returned call. No answer.  

## 2016-10-04 NOTE — Telephone Encounter (Signed)
Stacey [phone] called from [facility] regarding needing a order PT. Pt was supposed to be seen today and could not get in and key was not left out pt was sick. Misty Stanley wants to know if visit can be made up? Thank you!

## 2016-10-04 NOTE — Telephone Encounter (Signed)
Linda Mcdaniel [phone] called from [facility] regarding if orders can be signed for OT for home health for next certification period?  Thank you!

## 2016-10-16 ENCOUNTER — Emergency Department
Admission: EM | Admit: 2016-10-16 | Discharge: 2016-10-16 | Disposition: A | Discharge status: Home / Self Care | Payer: Medicare Other | Attending: Emergency Medicine | Admitting: Emergency Medicine

## 2016-10-16 ENCOUNTER — Encounter: Payer: Self-pay | Admitting: Medical Oncology

## 2016-10-16 ENCOUNTER — Emergency Department: Payer: Medicare Other

## 2016-10-16 DIAGNOSIS — R1032 Left lower quadrant pain: Secondary | ICD-10-CM | POA: Diagnosis present

## 2016-10-16 DIAGNOSIS — Z87891 Personal history of nicotine dependence: Secondary | ICD-10-CM | POA: Diagnosis not present

## 2016-10-16 DIAGNOSIS — J449 Chronic obstructive pulmonary disease, unspecified: Secondary | ICD-10-CM | POA: Insufficient documentation

## 2016-10-16 DIAGNOSIS — Z791 Long term (current) use of non-steroidal anti-inflammatories (NSAID): Secondary | ICD-10-CM | POA: Diagnosis not present

## 2016-10-16 DIAGNOSIS — I10 Essential (primary) hypertension: Secondary | ICD-10-CM | POA: Insufficient documentation

## 2016-10-16 DIAGNOSIS — K5792 Diverticulitis of intestine, part unspecified, without perforation or abscess without bleeding: Secondary | ICD-10-CM | POA: Diagnosis not present

## 2016-10-16 DIAGNOSIS — G9389 Other specified disorders of brain: Secondary | ICD-10-CM | POA: Diagnosis not present

## 2016-10-16 DIAGNOSIS — R110 Nausea: Secondary | ICD-10-CM

## 2016-10-16 DIAGNOSIS — R109 Unspecified abdominal pain: Secondary | ICD-10-CM

## 2016-10-16 HISTORY — DX: Systemic involvement of connective tissue, unspecified: M35.9

## 2016-10-16 LAB — BASIC METABOLIC PANEL
ANION GAP: 9 (ref 5–15)
BUN: 13 mg/dL (ref 6–20)
CALCIUM: 8.9 mg/dL (ref 8.9–10.3)
CHLORIDE: 103 mmol/L (ref 101–111)
CO2: 25 mmol/L (ref 22–32)
Creatinine, Ser: 0.92 mg/dL (ref 0.44–1.00)
GFR calc non Af Amer: >60 mL/min (ref >60)
Glucose, Bld: 149 mg/dL — ABNORMAL HIGH (ref 65–99)
Potassium: 4 mmol/L (ref 3.5–5.1)
Sodium: 137 mmol/L (ref 135–145)

## 2016-10-16 LAB — URINALYSIS COMPLETE WITH MICROSCOPIC (ARMC ONLY)
Bacteria, UA: NONE SEEN
Bilirubin Urine: NEGATIVE
Glucose, UA: NEGATIVE mg/dL
HGB URINE DIPSTICK: NEGATIVE
KETONES UR: NEGATIVE mg/dL
LEUKOCYTES UA: NEGATIVE
Nitrite: NEGATIVE
PH: 6 (ref 5.0–8.0)
PROTEIN: NEGATIVE mg/dL
SPECIFIC GRAVITY, URINE: 1.018 (ref 1.005–1.030)

## 2016-10-16 LAB — CBC
HCT: 41.2 % (ref 35.0–47.0)
HEMOGLOBIN: 13.7 g/dL (ref 12.0–16.0)
MCH: 27.1 pg (ref 26.0–34.0)
MCHC: 33.4 g/dL (ref 32.0–36.0)
MCV: 81 fL (ref 80.0–100.0)
Platelets: 336 10*3/uL (ref 150–440)
RBC: 5.08 MIL/uL (ref 3.80–5.20)
RDW: 17.1 % — ABNORMAL HIGH (ref 11.5–14.5)
WBC: 8 10*3/uL (ref 3.6–11.0)

## 2016-10-16 LAB — GLUCOSE, CAPILLARY: Glucose-Capillary: 111 mg/dL — ABNORMAL HIGH (ref 65–99)

## 2016-10-16 LAB — HEPATIC FUNCTION PANEL
ALBUMIN: 3.3 g/dL — AB (ref 3.5–5.0)
ALT: 9 U/L — AB (ref 14–54)
AST: 19 U/L (ref 15–41)
Alkaline Phosphatase: 71 U/L (ref 38–126)
Bilirubin, Direct: 0.1 mg/dL (ref 0.1–0.5)
Indirect Bilirubin: 0.4 mg/dL (ref 0.3–0.9)
TOTAL PROTEIN: 7.8 g/dL (ref 6.5–8.1)
Total Bilirubin: 0.5 mg/dL (ref 0.3–1.2)

## 2016-10-16 LAB — LIPASE, BLOOD: Lipase: 18 U/L (ref 11–51)

## 2016-10-16 MED ORDER — METRONIDAZOLE 500 MG PO TABS
500.0000 mg | ORAL_TABLET | Freq: Once | ORAL | Status: AC
  Administered 2016-10-16: 500 mg via ORAL
  Filled 2016-10-16: qty 1

## 2016-10-16 MED ORDER — CIPROFLOXACIN HCL 500 MG PO TABS: 500.0000 mg | ORAL_TABLET | Freq: Two times a day (BID) | ORAL | 0 refills | Status: AC

## 2016-10-16 MED ORDER — METRONIDAZOLE 500 MG PO TABS: 500.0000 mg | ORAL_TABLET | Freq: Three times a day (TID) | ORAL | 0 refills | Status: AC

## 2016-10-16 MED ORDER — SODIUM CHLORIDE 0.9 % IV BOLUS (SEPSIS)
1000.0000 mL | INTRAVENOUS | Status: AC
  Administered 2016-10-16: 1000 mL via INTRAVENOUS

## 2016-10-16 MED ORDER — IOPAMIDOL (ISOVUE-300) INJECTION 61%
100.0000 mL | Freq: Once | INTRAVENOUS | Status: AC | PRN
  Administered 2016-10-16: 100 mL via INTRAVENOUS

## 2016-10-16 MED ORDER — IOPAMIDOL (ISOVUE-300) INJECTION 61%
30.0000 mL | Freq: Once | INTRAVENOUS | Status: AC | PRN
  Administered 2016-10-16: 30 mL via ORAL

## 2016-10-16 MED ORDER — ONDANSETRON 4 MG PO TBDP: ORAL_TABLET | ORAL | 0 refills | Status: AC

## 2016-10-16 MED ORDER — CIPROFLOXACIN HCL 500 MG PO TABS
500.0000 mg | ORAL_TABLET | ORAL | Status: AC
  Administered 2016-10-16: 500 mg via ORAL
  Filled 2016-10-16: qty 1

## 2016-10-16 NOTE — Discharge Instructions (Addendum)
We believe your symptoms are caused by diverticulitis.  Most of the time this condition (please read through the included information) can be cured with outpatient antibiotics.  Please take the full course of prescribed medication(s) and follow up with the doctors recommended above.  Return to the ED if your abdominal pain worsens or fails to improve, you develop bloody vomiting, bloody diarrhea, you are unable to tolerate fluids due to vomiting, fever greater than 101, or other symptoms that concern you.    

## 2016-10-16 NOTE — ED Triage Notes (Signed)
Pt was at neurology appt and while there pt began to have sudden nausea, flushed feeling and tremors all over. Per pts daughter pt has not talked much since the episode and pt has been feeling weak. Episode occurred at 1500.

## 2016-10-16 NOTE — ED Notes (Signed)
Pt presents after "period of being unresponsive" with extremities trembling while at the neurologist's office. Lasted 20-30 seconds. She did not pass out, but she did not respond to her family or the staff at the office. Family member states she had something similar several years ago, had a workup, and no cause was found. Daughter thinks it might have been a stroke at that time, but was told it was not. Pt doesn't remember what happened, only being in the car coming here (pt has memory issues - one of the reasons she was in neurology). Pt has signs of dementia, but has not been formally diagnosed. Daughter denies seizures in the past.

## 2016-10-16 NOTE — ED Provider Notes (Signed)
[facility] Emergency Department Provider Note  ____________________________________________   First MD Initiated Contact with Patient 10/16/16 1715     (approximate)  I have reviewed the triage vital signs and the nursing notes.   HISTORY  Chief Complaint Nausea and Tremors    HPI Linda Mcdaniel is a 70 y.o. female who presents for evaluation of an episode of acute onset nausea, feeling flushed, and having some tremors while in the office of Dr. Malvin Johns (neurology).  Her family feels like she is been less responsive than usual since the episode although she is alert and oriented and talking just fine at the time of my evaluation.  She denies having any issues recently in terms of other acute medical complaints, but after the nausea resolved she reports that she has been having some pain in her left lower quadrant of her abdomen.  She has rheumatoid arthritis and has also been having somewhat random pains in various bones and areas of her body, but the consistent complaint was the acute onset of nausea and the left lower quadrant abdominal pain.  She reports that she has had diverticulitis in the past but that this feels different.  Nothing makes the symptoms better nor worse although she did improve on her own.  She denies any recent fever/chills, chest pain, shortness of breath, dysuria.   Past Medical History:  Diagnosis Date  . Arthritis   . Chicken pox   . Collagen vascular disease (HCC)   . Diverticulitis   . GERD (gastroesophageal reflux disease)   . Heart murmur   . Rheumatic fever     Patient Active Problem List   Diagnosis Date Noted  . Vitamin B 12 deficiency 08/01/2016  . Knee pain, bilateral 08/01/2016  . Weakness of both lower extremities 07/02/2016  . NSAID long-term use 04/08/2016  . Cold intolerance 04/08/2016  . Encounter to establish care 04/08/2016  . Rheumatoid arthritis (HCC) 04/08/2016  . GERD (gastroesophageal reflux disease)  04/08/2016  . Diverticulosis of colon without hemorrhage 04/08/2016  . 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 Heart murmur 04/08/2016  . H/O blood clots 04/08/2016  . Rheumatic fever 04/08/2016  . Benign essential HTN 04/08/2016  . Fibromyalgia 04/08/2016    Past Surgical History:  Procedure Laterality Date  . CESAREAN SECTION  1965  . CESAREAN SECTION  1978    Prior to Admission medications   Medication Sig Start Date End Date Taking? Authorizing Provider  acetaminophen (TYLENOL) 650 MG CR tablet Take 1 tablet by mouth every 8 (eight) hours.   Yes Historical Provider, MD  cyanocobalamin (,VITAMIN B-12,) 1000 MCG/ML injection INJECT 1 ML (1,000 MCG TOTAL) INTO THE MUSCLE ONCE A WEEK. STARTING 08/08/16 Patient taking differently: Inject 1,000 mcg into the muscle every 30 (thirty) days. Starting 08/08/16 09/16/16  Yes Glori Luis, MD  diclofenac (VOLTAREN) 75 MG EC tablet TAKE 1 TABLET (75 MG TOTAL) BY MOUTH 2 (TWO) TIMES DAILY AS NEEDED. TAKE WITH FOOD 09/04/16  Yes Glori Luis, MD  folic acid (FOLVITE) 1 MG tablet Take 1 tablet (1 mg total) by mouth daily. 08/01/16  Yes Glori Luis, MD  methotrexate (RHEUMATREX) 2.5 MG tablet Take 4 tablets by mouth once a week. 09/27/16  Yes Historical Provider, MD  Vitamin D, Ergocalciferol, (DRISDOL) 50000 units CAPS capsule Take 1 capsule by mouth once a week. 09/27/16  Yes Historical Provider, MD  ciprofloxacin (CIPRO) 500 MG tablet Take 1 tablet (500 mg total) by mouth 2 (two) times daily.  10/16/16 10/26/16  Loleta Rose, MD  metroNIDAZOLE (FLAGYL) 500 MG tablet Take 1 tablet (500 mg total) by mouth 3 (three) times daily. 10/16/16 10/26/16  Loleta Rose, MD  ondansetron (ZOFRAN ODT) 4 MG disintegrating tablet Allow 1-2 tablets to dissolve in your mouth every 8 hours as needed for nausea/vomiting 10/16/16   Loleta Rose, MD    Allergies Lorabid [loracarbef] and Penicillins  Family History  Problem Relation Age of Onset  . Alcohol abuse Mother    . Heart disease Mother   . Hypertension Mother   . Stroke Mother   . Alcohol abuse Father   . Diabetes Sister   . Alcohol abuse Brother   . Arthritis Maternal Grandmother   . Stroke Maternal Grandmother     Social History Social History  Substance Use Topics  . Smoking status: Former Games developer  . Smokeless tobacco: Never Used  . Alcohol use 0.0 oz/week    Review of Systems Constitutional: No fever/chills Eyes: No visual changes. ENT: No sore throat. Cardiovascular: Denies chest pain. Respiratory: Denies shortness of breath. Gastrointestinal: LLQ abdominal pain.  Nausea, no vomiting.  No diarrhea.  No constipation. Genitourinary: Negative for dysuria. Musculoskeletal: Negative for back pain.  Chronic extremity pain due to RA. Skin: Negative for rash. Neurological: Negative for headaches, focal weakness or numbness.  Some transient confusion.  10-point ROS otherwise negative.  ____________________________________________   PHYSICAL EXAM:  VITAL SIGNS: ED Triage Vitals  Enc Vitals Group     BP 10/16/16 1519 (!) 59/39     Pulse Rate 10/16/16 1519 (!) 53     Resp 10/16/16 1519 16     Temp 10/16/16 1519 97.5 F (36.4 C)     Temp Source 10/16/16 1519 Oral     SpO2 10/16/16 1519 97 %     Weight 10/16/16 1522 125 lb (56.7 kg)     Height 10/16/16 1522 5' (1.524 m)     Head Circumference --      Peak Flow --      Pain Score 10/16/16 1522 8     Pain Loc --      Pain Edu? --      Excl. in GC? --     Constitutional: Alert and oriented. Well appearing and in no acute distress. Eyes: Conjunctivae are normal. PERRL. EOMI. Head: Atraumatic. Nose: No congestion/rhinnorhea. Mouth/Throat: Mucous membranes are moist.  Oropharynx non-erythematous. Neck: No stridor.  No meningeal signs.   Cardiovascular: Normal rate, regular rhythm. Good peripheral circulation. Grossly normal heart sounds. Respiratory: Normal respiratory effort.  No retractions. Lungs CTAB. Gastrointestinal:  Soft with tenderness to palpation of the left lower quadrant and right upper quadrant with guarding in both regions.  No right lower quadrant tenderness to palpation. Musculoskeletal: No lower extremity tenderness nor edema. No gross deformities of extremities. Neurologic:  Normal speech and language. No gross focal neurologic deficits are appreciated.  No observable cranial nerve deficits.   Skin:  Skin is warm, dry and intact. No rash noted. Psychiatric: Mood and affect are normal. Speech and behavior are normal.  ____________________________________________   LABS (all labs ordered are listed, but only abnormal results are displayed)  Labs Reviewed  GLUCOSE, CAPILLARY - Abnormal; Notable for the following:       Result Value   Glucose-Capillary 111 (*)    All other components within normal limits  BASIC METABOLIC PANEL - Abnormal; Notable for the following:    Glucose, Bld 149 (*)    All other components within normal limits  CBC - Abnormal; Notable for the following:    RDW 17.1 (*)    All other components within normal limits  URINALYSIS COMPLETEWITH MICROSCOPIC ([facility] ONLY) - Abnormal; Notable for the following:    Color, Urine YELLOW (*)    APPearance CLEAR (*)    Squamous Epithelial / LPF 0-5 (*)    All other components within normal limits  HEPATIC FUNCTION PANEL - Abnormal; Notable for the following:    Albumin 3.3 (*)    ALT 9 (*)    All other components within normal limits  LIPASE, BLOOD  CBG MONITORING, ED   ____________________________________________  EKG  ED ECG REPORT I, Kristof Nadeem, the attending physician, personally viewed and interpreted this ECG.  Date: 10/16/2016 EKG Time: 15:36 Rate: 59 Rhythm: borderline bradycardia QRS Axis: normal Intervals: normal ST/T Wave abnormalities: inverted T waves in II, III, avF, V2-V6 Conduction Disturbances: none Narrative Interpretation: T wave inversions were present on a prior EKG obtained several months  ago.  No evidence of acute ischemia at this time.  ____________________________________________  RADIOLOGY   Ct Head Wo Contrast  Result Date: 10/16/2016 CLINICAL DATA:  Acute onset of unresponsiveness. Extremity trembling. Initial encounter. EXAM: CT HEAD WITHOUT CONTRAST TECHNIQUE: Contiguous axial images were obtained from the base of the skull through the vertex without intravenous contrast. COMPARISON:  CT of the head performed 03/27/2011, and MRI of the brain performed 04/16/2011 FINDINGS: Brain: No evidence of acute infarction, hemorrhage, hydrocephalus, extra-axial collection or mass lesion/mass effect. Prominence of the ventricles and sulci reflects mild to moderate cortical volume loss. Mild cerebellar atrophy is noted. Scattered periventricular and subcortical white matter change likely reflects small vessel ischemic microangiopathy. The brainstem and fourth ventricle are within normal limits. The basal ganglia are unremarkable in appearance. The cerebral hemispheres demonstrate grossly normal gray-white differentiation. No mass effect or midline shift is seen. Vascular: No hyperdense vessel or unexpected calcification. Skull: There is no evidence of fracture; visualized osseous structures are unremarkable in appearance. Sinuses/Orbits: The visualized portions of the orbits are within normal limits. The paranasal sinuses and mastoid air cells are well-aerated. Other: No significant soft tissue abnormalities are seen. IMPRESSION: 1. No acute intracranial pathology seen on CT. 2. Mild to moderate cortical volume loss and scattered small vessel ischemic microangiopathy. Electronically Signed   By: Roanna Raider M.D.   On: 10/16/2016 20:45   Ct Abdomen Pelvis W Contrast  Result Date: 10/16/2016 CLINICAL DATA:  Right upper quadrant and left lower quadrant abdominal pain and nausea. Episode of unresponsiveness. Dementia. EXAM: CT ABDOMEN AND PELVIS WITH CONTRAST TECHNIQUE: Multidetector CT  imaging of the abdomen and pelvis was performed using the standard protocol following bolus administration of intravenous contrast. CONTRAST:  ISOVUE-300 IOPAMIDOL (ISOVUE-300) INJECTION 61% COMPARISON:  None. FINDINGS: Lower chest: Lung bases are clear without focal nodule, mass, or airspace disease. The heart is mildly enlarged. Coronary artery calcifications are present. No significant pleural or pericardial effusion is present. Hepatobiliary: No focal hepatic lesions are present. The liver contour is smooth. The common bile duct and gallbladder are normal. Pancreas: No significant inflammatory changes are present. There is no solid or cystic mass lesion. No significant ductal dilation is present. Spleen: Within normal limits. Adrenals/Urinary Tract: The adrenal glands are normal bilaterally. Kidneys and ureters are within normal limits. The ureters are unremarkable. The urinary bladder is within normal limits. Stomach/Bowel: The stomach is mildly distended, but within normal limits. The duodenum is unremarkable. Small bowel is normal. The appendix is visualized and  within normal limits. Diverticular changes are present in the ascending colon without significant inflammation. Transverse colon is normal. Diverticular changes are present throughout the descending colon. There is diverticular changes in the sigmoid colon with wall thickening suggesting diverticulitis. Mild pericolonic inflammatory changes are noted in the descending colon. Moderate stool is present at the rectum. Vascular/Lymphatic: Extensive atherosclerotic calcifications are present without aneurysm. No significant adenopathy is present. Reproductive: Uterus and adnexa are within normal limits for age. Other: No significant free fluid or free air is present. Musculoskeletal: Slight degenerative anterolisthesis is present at L5-S1. Vertebral body heights and alignment are otherwise maintained. There is slight right lateral listhesis at L3-4  with mild leftward curvature at L4-5 in rightward curvature in the lower thoracic spine. The bony pelvis is intact. IMPRESSION: 1. Diffuse diverticular changes throughout the colon with mild inflammatory changes and wall thickening in the descending and sigmoid colon suggesting early diverticulitis. No complicating features are present. 2. Additional diverticular changes are present in the ascending and proximal transverse colon without other focal inflammation. 3. Extensive atherosclerotic disease without aneurysm. 4. Coronary artery disease. 5. Moderate stool at the rectum. Electronically Signed   By: Marin Roberts M.D.   On: 10/16/2016 20:53    ____________________________________________   PROCEDURES  Procedure(s) performed:   Procedures   Critical Care performed: No ____________________________________________   INITIAL IMPRESSION / ASSESSMENT AND PLAN / ED COURSE  Pertinent labs & imaging results that were available during my care of the patient were reviewed by me and considered in my medical decision making (see chart for details).  The patient has some tenderness to palpation of her abdomen and the report of acute onset of nausea with some tremors as well as random pains in various joints in her extremities.  I do not think that the pain symptoms are related but the nausea followed by abdominal pain may represent an intra-abdominal pathology.  Her initial blood pressures were quite low but I am not sure if they were accurate because they came up quite quickly and her workup has not been consistent with severe hypotension.  She is extremely well-appearing and in no acute distress with a normal blood pressure on my physical evaluation and interview.  Given the tenderness palpation of her abdomen as well as the symptoms of tremor and altered mental status I will check a noncontrast head CT and a CT scan of the abdomen and pelvis with IV and by mouth contrast.  I will then reassess  and discuss disposition plans with the patient and family.   Clinical Course  Comment By Time  (delayed documentation)  Patient has felt well throughout ED stay.  Except for the initial and likely erroneous BP measurements, vitals have been stable throughout her stay.  Head CT unremarkable.  Abd CT suggestive of early uncomplicated diverticulitis which is consistent with nausea and LLQ pain/tenderness.  Will treat empirically with Cipro/Flagyl and Zofran.  Gave first dose in ED.  Discussed results of workup with patient and family.  Encouraged close outpt follow up and gave return precautions.  They understand and agree with the plan. Loleta Rose, MD 10/18 2243    ____________________________________________  FINAL CLINICAL IMPRESSION(S) / ED DIAGNOSES  Final diagnoses:  Abdominal pain, unspecified abdominal location  Nausea  Diverticulitis of intestine without perforation or abscess, unspecified bleeding status, unspecified part of intestinal tract     MEDICATIONS GIVEN DURING THIS VISIT:  Medications  sodium chloride 0.9 % bolus 1,000 mL (0 mLs Intravenous Stopped 10/16/16  1646)  iopamidol (ISOVUE-300) 61 % injection 30 mL (30 mLs Oral Contrast Given 10/16/16 1823)  iopamidol (ISOVUE-300) 61 % injection 100 mL (100 mLs Intravenous Contrast Given 10/16/16 2005)  ciprofloxacin (CIPRO) tablet 500 mg (500 mg Oral Given 10/16/16 2143)  metroNIDAZOLE (FLAGYL) tablet 500 mg (500 mg Oral Given 10/16/16 2143)     NEW OUTPATIENT MEDICATIONS STARTED DURING THIS VISIT:  Discharge Medication List as of 10/16/2016  9:11 PM    START taking these medications   Details  ciprofloxacin (CIPRO) 500 MG tablet Take 1 tablet (500 mg total) by mouth 2 (two) times daily., Starting Wed 10/16/2016, Until Sat 10/26/2016, Print    metroNIDAZOLE (FLAGYL) 500 MG tablet Take 1 tablet (500 mg total) by mouth 3 (three) times daily., Starting Wed 10/16/2016, Until Sat 10/26/2016, Print    ondansetron (ZOFRAN  ODT) 4 MG disintegrating tablet Allow 1-2 tablets to dissolve in your mouth every 8 hours as needed for nausea/vomiting, Print        Discharge Medication List as of 10/16/2016  9:11 PM      Discharge Medication List as of 10/16/2016  9:11 PM       Note:  This document was prepared using Dragon voice recognition software and may include unintentional dictation errors.    Loleta Roseory Ronika Kelson, MD 10/16/16 2245

## 2016-10-22 ENCOUNTER — Other Ambulatory Visit: Payer: Self-pay | Admitting: Family Medicine

## 2016-10-22 NOTE — Telephone Encounter (Signed)
Last filled 09/23/16. Last seen 08/01/16.

## 2016-10-23 NOTE — Telephone Encounter (Signed)
Sent to pharmacy. Patient needs follow-up in the next month.

## 2016-10-25 NOTE — Telephone Encounter (Signed)
Received a PA for [facility], per the note it was to be ran through Medicare Part D on November 1st, called pharmacy and they will hold till then as the PA was denied through [place] tracks last month.  thanks

## 2016-11-05 ENCOUNTER — Encounter: Payer: Self-pay | Admitting: Family Medicine

## 2016-11-05 NOTE — Telephone Encounter (Signed)
Continuation from first MyChart message sent today.

## 2016-11-13 ENCOUNTER — Ambulatory Visit
Admission: RE | Admit: 2016-11-13 | Discharge: 2016-11-13 | Disposition: A | Discharge status: Home / Self Care | Payer: Medicare Other | Source: Ambulatory Visit | Attending: Family Medicine | Admitting: Family Medicine

## 2016-11-13 ENCOUNTER — Other Ambulatory Visit
Admission: RE | Admit: 2016-11-13 | Discharge: 2016-11-13 | Disposition: A | Discharge status: Home / Self Care | Payer: Medicare Other | Source: Ambulatory Visit | Attending: Family Medicine | Admitting: Family Medicine

## 2016-11-13 ENCOUNTER — Encounter: Payer: Self-pay | Admitting: Family Medicine

## 2016-11-13 ENCOUNTER — Ambulatory Visit (INDEPENDENT_AMBULATORY_CARE_PROVIDER_SITE_OTHER): Payer: Medicare Other | Admitting: Family Medicine

## 2016-11-13 ENCOUNTER — Telehealth: Payer: Self-pay | Admitting: Family Medicine

## 2016-11-13 VITALS — BP 112/68 | HR 98 | Temp 98.2°F | Wt 118.0 lb

## 2016-11-13 DIAGNOSIS — R109 Unspecified abdominal pain: Secondary | ICD-10-CM | POA: Diagnosis not present

## 2016-11-13 DIAGNOSIS — I700 Atherosclerosis of aorta: Secondary | ICD-10-CM | POA: Insufficient documentation

## 2016-11-13 DIAGNOSIS — K5730 Diverticulosis of large intestine without perforation or abscess without bleeding: Secondary | ICD-10-CM | POA: Diagnosis not present

## 2016-11-13 DIAGNOSIS — I951 Orthostatic hypotension: Secondary | ICD-10-CM | POA: Diagnosis not present

## 2016-11-13 DIAGNOSIS — K5900 Constipation, unspecified: Secondary | ICD-10-CM

## 2016-11-13 LAB — CBC
HEMATOCRIT: 38.5 % (ref 35.0–47.0)
HEMOGLOBIN: 12.6 g/dL (ref 12.0–16.0)
MCH: 26.9 pg (ref 26.0–34.0)
MCHC: 32.8 g/dL (ref 32.0–36.0)
MCV: 82 fL (ref 80.0–100.0)
Platelets: 117 10*3/uL — ABNORMAL LOW (ref 150–440)
RBC: 4.69 MIL/uL (ref 3.80–5.20)
RDW: 18.3 % — ABNORMAL HIGH (ref 11.5–14.5)
WBC: 6.7 10*3/uL (ref 3.6–11.0)

## 2016-11-13 LAB — COMPREHENSIVE METABOLIC PANEL
ALK PHOS: 68 U/L (ref 38–126)
ALT: 7 U/L — ABNORMAL LOW (ref 14–54)
ANION GAP: 10 (ref 5–15)
AST: 13 U/L — ABNORMAL LOW (ref 15–41)
Albumin: 3.4 g/dL — ABNORMAL LOW (ref 3.5–5.0)
BILIRUBIN TOTAL: 0.8 mg/dL (ref 0.3–1.2)
BUN: 15 mg/dL (ref 6–20)
CALCIUM: 8.4 mg/dL — AB (ref 8.9–10.3)
CO2: 23 mmol/L (ref 22–32)
CREATININE: 1.04 mg/dL — AB (ref 0.44–1.00)
Chloride: 98 mmol/L — ABNORMAL LOW (ref 101–111)
GFR calc non Af Amer: 53 mL/min — ABNORMAL LOW (ref >60)
Glucose, Bld: 99 mg/dL (ref 65–99)
Potassium: 3.3 mmol/L — ABNORMAL LOW (ref 3.5–5.1)
Sodium: 131 mmol/L — ABNORMAL LOW (ref 135–145)
TOTAL PROTEIN: 7.8 g/dL (ref 6.5–8.1)

## 2016-11-13 MED ORDER — IOPAMIDOL (ISOVUE-300) INJECTION 61%
100.0000 mL | Freq: Once | INTRAVENOUS | Status: AC | PRN
  Administered 2016-11-13: 100 mL via INTRAVENOUS

## 2016-11-13 NOTE — Progress Notes (Signed)
Pre visit review using our clinic review tool, if applicable. No additional management support is needed unless otherwise documented below in the visit note. 

## 2016-11-13 NOTE — Progress Notes (Signed)
Tommi Rumps, MD Phone: [phone]  Linda Mcdaniel is a 70 y.o. female who presents today for follow-up.  Patient was seen in the ED about a month ago. She had an event in the neurologist office where she felt weak all of a sudden and felt like she was going to be sick. Possibly had some slurred speech. She was then sent to the emergency room. She was diagnosed with diverticulitis. She was given fluids for low blood pressure and treated with antibiotics. She was discharged home as she was feeling better after the fluids. She notes one additional episode like this several weeks ago with no slurred speech. Notes this has not recurred. She does get lightheaded almost anytime she stands up. She's not had any loss of consciousness. She notes she sits down and this gets better. Patient notes she has not had a bowel movement in 7 days. She is passing gas. Her abdomen feels full although there is no specific pain. Notes she was having normal bowel movements prior to 7 days ago. They have been trying to get her to eat well to see if she can have a bowel movement at home.  PMH: Former smoker   ROS see history of present illness  Objective  Physical Exam Vitals:   11/13/16 1320  BP: 112/68  Pulse: 98  Temp: 98.2 F (36.8 C)   Laying blood pressure 128/72 pulse 98 Sitting blood pressure 104/66 pulse 106 Standing blood pressure 88/56 pulse 113  BP Readings from Last 3 Encounters:  11/13/16 112/68  10/16/16 (!) 165/70  08/01/16 126/68   Wt Readings from Last 3 Encounters:  11/13/16 118 lb (53.5 kg)  10/16/16 125 lb (56.7 kg)  06/26/16 145 lb (65.8 kg)    Physical Exam  Constitutional: No distress.  Cardiovascular: Normal rate, regular rhythm and normal heart sounds.   Pulmonary/Chest: Effort normal and breath sounds normal.  Abdominal: Soft. Bowel sounds are normal. She exhibits no distension. There is tenderness (mild left-sided tenderness). There is guarding (Mild intermittent  guarding on palpation of left side of abdomen). There is no rebound.  Musculoskeletal: She exhibits no edema.  Neurological: She is alert.  Skin: Skin is warm and dry. She is not diaphoretic.     Assessment/Plan: Please see individual problem list.  Orthostatic hypotension Patient's lightheadedness is due to orthostatic hypotension. Orthostatics today are positive. She's not on any medications to cause this. Could be related dehydration though pulse appears normal. We will check CMP and CBC to evaluate for contributing causes. She will stay well hydrated. If hydration is not beneficial workup for autonomic cause through neurology as warranted. Given return precautions.  Constipation I suspect patient's lack of bowel movement over the last 7 days is related to constipation though given her discomfort on palpation and lack of bowel movement concern would be for bowel obstruction. We will obtain a CT scan to rule this out. Also to evaluate for recurrent diverticulitis. Once this returns we will determine the next step in management. She is given return precautions.   Orders Placed This Encounter  Procedures  . CT Abdomen Pelvis W Contrast    Standing Status:   Future    Number of Occurrences:   1    Standing Expiration Date:   02/13/2018    Order Specific Question:   If indicated for the ordered procedure, I authorize the administration of contrast media per Radiology protocol    Answer:   Yes    Order Specific Question:  Reason for Exam (SYMPTOM  OR DIAGNOSIS REQUIRED)    Answer:   left sided abdominal pain, mild intermittent guarding, no BM in 7 days    Order Specific Question:   Preferred imaging location?    Answer:   [facility]    Order Specific Question:   Call Results- Best Contact Number?    Answer:   call report [phone]/ hold patient until doctor call  . CBC    Standing Status:   Future    Number of Occurrences:   1    Standing Expiration Date:   11/13/2017  . Comp  Met (CMET)    Standing Status:   Future    Number of Occurrences:   1    Standing Expiration Date:   11/13/2017    Tommi Rumps, MD [place]

## 2016-11-13 NOTE — Assessment & Plan Note (Signed)
I suspect patient's lack of bowel movement over the last 7 days is related to constipation though given her discomfort on palpation and lack of bowel movement concern would be for bowel obstruction. We will obtain a CT scan to rule this out. Also to evaluate for recurrent diverticulitis. Once this returns we will determine the next step in management. She is given return precautions.

## 2016-11-13 NOTE — Patient Instructions (Signed)
Nice to see you. We are going to obtain a CT scan to evaluate your abdominal discomfort. Please keep your appointment with the neurologist. Please stay well hydrated. If you develop abdominal pain, nausea, vomiting, persistent lightheadedness, loss of consciousness, or any new or changing symptoms please seek medical attention immediately.

## 2016-11-13 NOTE — Assessment & Plan Note (Addendum)
Patient's lightheadedness is due to orthostatic hypotension. Orthostatics today are positive. She's not on any medications to cause this. Could be related dehydration though pulse appears normal. We will check CMP and CBC to evaluate for contributing causes. She will stay well hydrated. If hydration is not beneficial workup for autonomic cause through neurology as warranted. Given return precautions.

## 2016-11-13 NOTE — Telephone Encounter (Signed)
Called and spoke with patient's daughter regarding CT results. No acute changes. Did reveal constipation. Discussed MiraLAX to help with this. One scoop daily over-the-counter. They will call on Friday if not having a bowel movement. We will await lab work that is being drawn now through the lab at the Hospital. [place] call them once this has returned.

## 2016-11-18 ENCOUNTER — Other Ambulatory Visit: Payer: Self-pay | Admitting: Family Medicine

## 2016-11-18 MED ORDER — POTASSIUM CHLORIDE CRYS ER 20 MEQ PO TBCR: 20.0000 meq | EXTENDED_RELEASE_TABLET | Freq: Every day | ORAL | 0 refills | Status: DC

## 2016-11-25 ENCOUNTER — Telehealth: Payer: Self-pay | Admitting: Family Medicine

## 2016-11-25 DIAGNOSIS — R634 Abnormal weight loss: Secondary | ICD-10-CM

## 2016-11-25 DIAGNOSIS — Z720 Tobacco use: Secondary | ICD-10-CM

## 2016-11-25 NOTE — Telephone Encounter (Signed)
Attempted to call patient and her daughter to follow-up on her weight. Left a message asking them to call back to the office. Will attempt to call again tomorrow.

## 2016-11-27 ENCOUNTER — Other Ambulatory Visit: Payer: Self-pay | Admitting: Family Medicine

## 2016-11-27 NOTE — Telephone Encounter (Signed)
Pt daughter called back turning your call. You may anytime the rest of the afternoon. Thank you!  Call pt @ [phone]

## 2016-11-27 NOTE — Telephone Encounter (Signed)
Called and spoke with patient's daughter regarding weight loss. Patient noted to be down about 30 pounds over the last 5 months. Patient's daughter notes she just doesn't have much of an appetite. Patient was not available to speak to. I get the patient's cell phone number and I will give her a call tomorrow at [phone] to discuss this with her.

## 2016-11-27 NOTE — Telephone Encounter (Signed)
Please advise 

## 2016-11-28 NOTE — Telephone Encounter (Signed)
Spoke with patient. She reports no intentional weight loss. Has been eating well. She believes she's had a mammogram in the last year though her daughter declined this yesterday. Reports previous colonoscopy though does not remember when this was. She reports she would not have another one of these. She notes a former smoking history of up to 3 packs a day. No prior abnormal Pap smears per her report. We will order lab work as outlined below. Order cologuard to evaluate for colon cancer. I attempted to order CT scan of her chest though apparently Medicare will not cover this for the unexplained weight loss diagnosis. I will have CMA check with referral coordinator to see if there is any other code that will cover this scan in the patient's chart. I have CMA contact the patient to get them set up for a lab appointment.

## 2016-11-28 NOTE — Telephone Encounter (Signed)
Patient needs to have labs scheduled prior to getting this refilled. Thanks.

## 2016-11-29 NOTE — Telephone Encounter (Signed)
Set patient up for labs

## 2016-11-29 NOTE — Telephone Encounter (Signed)
Patient has labs scheduled

## 2016-12-02 ENCOUNTER — Other Ambulatory Visit: Payer: Medicare Other

## 2016-12-06 ENCOUNTER — Telehealth: Payer: Self-pay | Admitting: Family Medicine

## 2016-12-06 ENCOUNTER — Encounter: Payer: Self-pay | Admitting: Family Medicine

## 2016-12-06 ENCOUNTER — Other Ambulatory Visit (INDEPENDENT_AMBULATORY_CARE_PROVIDER_SITE_OTHER): Payer: Medicare Other

## 2016-12-06 DIAGNOSIS — R634 Abnormal weight loss: Secondary | ICD-10-CM

## 2016-12-06 LAB — COMPREHENSIVE METABOLIC PANEL
ALBUMIN: 3.4 g/dL — AB (ref 3.5–5.2)
ALK PHOS: 71 U/L (ref 39–117)
ALT: 6 U/L (ref 0–35)
AST: 12 U/L (ref 0–37)
BUN: 15 mg/dL (ref 6–23)
CALCIUM: 8.7 mg/dL (ref 8.4–10.5)
CHLORIDE: 102 meq/L (ref 96–112)
CO2: 27 mEq/L (ref 19–32)
CREATININE: 0.82 mg/dL (ref 0.40–1.20)
GFR: 72.87 mL/min (ref >60.00)
Glucose, Bld: 162 mg/dL — ABNORMAL HIGH (ref 70–99)
POTASSIUM: 3.3 meq/L — AB (ref 3.5–5.1)
Sodium: 138 mEq/L (ref 135–145)
Total Bilirubin: 0.9 mg/dL (ref 0.2–1.2)
Total Protein: 7 g/dL (ref 6.0–8.3)

## 2016-12-06 LAB — CBC
HEMATOCRIT: 36.9 % (ref 36.0–46.0)
HEMOGLOBIN: 12.3 g/dL (ref 12.0–15.0)
MCHC: 33.3 g/dL (ref 30.0–36.0)
MCV: 84.3 fl (ref 78.0–100.0)
PLATELETS: 250 10*3/uL (ref 150.0–400.0)
RBC: 4.38 Mil/uL (ref 3.87–5.11)
RDW: 20.4 % — ABNORMAL HIGH (ref 11.5–15.5)
WBC: 8.6 10*3/uL (ref 4.0–10.5)

## 2016-12-06 LAB — TSH: TSH: 2.22 u[IU]/mL (ref 0.35–4.50)

## 2016-12-06 LAB — SEDIMENTATION RATE: SED RATE: 41 mm/h — AB (ref 0–30)

## 2016-12-06 MED ORDER — POTASSIUM CHLORIDE CRYS ER 20 MEQ PO TBCR: 20.0000 meq | EXTENDED_RELEASE_TABLET | Freq: Every day | ORAL | 0 refills | Status: AC

## 2016-12-06 NOTE — Addendum Note (Signed)
Addended by: Glori Luis on: 12/06/2016 06:34 PM   Modules accepted: Orders

## 2016-12-06 NOTE — Telephone Encounter (Signed)
Attempted to call patient regarding results. There is no answer. I left a message advising that I would send a my chart message with this information. No identifying information was left on the voicemail. Potassium slightly low. Other lab work improved. We will send a my chart message regarding increase potassium in diet and supplementation with potassium for several days. We'll touch base with them on Monday as well.

## 2016-12-09 NOTE — Telephone Encounter (Signed)
Patient viewed My chart message on 12/06/16

## 2016-12-09 NOTE — Telephone Encounter (Signed)
Patient has had labs. Do you want to refill this now.

## 2016-12-10 NOTE — Telephone Encounter (Signed)
Please determine if patient still needs refills. Thanks.

## 2016-12-11 ENCOUNTER — Telehealth: Payer: Self-pay | Admitting: Surgical

## 2016-12-11 ENCOUNTER — Encounter: Payer: Self-pay | Admitting: Family Medicine

## 2016-12-11 ENCOUNTER — Other Ambulatory Visit: Payer: Self-pay | Admitting: Family Medicine

## 2016-12-11 DIAGNOSIS — R634 Abnormal weight loss: Secondary | ICD-10-CM

## 2016-12-11 DIAGNOSIS — Z1239 Encounter for other screening for malignant neoplasm of breast: Secondary | ICD-10-CM

## 2016-12-11 NOTE — Telephone Encounter (Signed)
Patients daughter called and said that she was going to take mom to [place] to be seen today

## 2016-12-11 NOTE — Telephone Encounter (Signed)
Spoke with patients daughter about the My chart message. Per Dr. Birdie Sons patient needs to be looked at today. Patients daughter stated that she would try to get off of work to take her, but wanted appointment for tomorrow just in case. I advised again that Dr. Birdie Sons wanted the patient looked at today. Daughter said that her schedule is open tomorrow, but not today. I ask her to call the office to let us know if she was going to be able to take her mother to a clinic today. She ask about going to [facility] and there was no appointments available there. I told her that she could take her to a walk in clinic or an urgent care. She ask about the closest one I told her that [facility] walk in was probably the closest place. She again said that she would try to take her, but not sure that she would be able to take her today.

## 2016-12-11 NOTE — Telephone Encounter (Signed)
Attempted to call patient though there was no answer. Left a message asking her to call back to the office. I call and spoke to the patient's daughter. Patient's daughter notes she is getting ready to leave to go get her mother to take her for evaluation. She does note that the swelling has reportedly gotten worse through what her children say today. The daughter just spoke with the patient and the patient is hesitant to go for evaluation. They do not report any other symptoms other than facial swelling. I discussed with the patient's daughter that it is necessary for her to be evaluated today particularly given the progression of the swelling to rule out infectious causes or other causes. She will pick the patient up soon and take her for evaluation this evening.

## 2016-12-12 ENCOUNTER — Ambulatory Visit: Payer: Medicare Other | Admitting: Family

## 2016-12-12 MED ORDER — DICLOFENAC SODIUM 75 MG PO TBEC: 75.0000 mg | DELAYED_RELEASE_TABLET | Freq: Two times a day (BID) | ORAL | 0 refills | Status: DC | PRN

## 2016-12-12 NOTE — Telephone Encounter (Signed)
Refill sent to pharmacy.   

## 2016-12-12 NOTE — Telephone Encounter (Signed)
Daughter said that her mother is still taking medication

## 2017-01-05 ENCOUNTER — Other Ambulatory Visit: Payer: Self-pay | Admitting: Family Medicine

## 2017-01-06 ENCOUNTER — Encounter: Payer: Self-pay | Admitting: Family Medicine

## 2017-01-06 NOTE — Telephone Encounter (Signed)
Last filled 12/12/16 30 0rf

## 2017-01-06 NOTE — Telephone Encounter (Signed)
Patients daughter states she will have this done at [facility]

## 2017-01-06 NOTE — Telephone Encounter (Signed)
Sent to pharmacy. Please see if the patient can come in for the chest x-ray that was previously ordered to evaluate her weight loss. Thanks.

## 2017-02-07 ENCOUNTER — Ambulatory Visit: Payer: Medicare Other

## 2017-07-07 ENCOUNTER — Telehealth: Payer: Self-pay | Admitting: Family Medicine

## 2017-07-07 NOTE — Telephone Encounter (Signed)
faxed

## 2017-07-07 NOTE — Telephone Encounter (Signed)
[facility] for [facility], received office notes, however she did not receive demographics sheet and insurance information. Information faxed to [facility]

## 2017-07-07 NOTE — Telephone Encounter (Signed)
Denise from [facility] called and would like the most recent office notes for the patient faxed over. She does know that the last ov with Dr. Birdie Sons was in November. Please advise, thank you!  Call [facility] @ [phone] Fax @ [phone]

## 2017-07-30 DEATH — deceased
